# Patient Record
Sex: Male | Born: 1955 | Race: Black or African American | Hispanic: No | Marital: Married | Smoking: Former smoker
Health system: Southern US, Community
[De-identification: ages and names within clinical notes are randomized; demographics above are authoritative.]

## PROBLEM LIST (undated history)

## (undated) DIAGNOSIS — I10 Essential (primary) hypertension: Secondary | ICD-10-CM

## (undated) DIAGNOSIS — Z5189 Encounter for other specified aftercare: Secondary | ICD-10-CM

## (undated) HISTORY — PX: OTHER SURGICAL HISTORY: SHX169

## (undated) HISTORY — DX: Encounter for other specified aftercare: Z51.89

---

## 1998-01-28 ENCOUNTER — Ambulatory Visit (HOSPITAL_COMMUNITY): Admission: RE | Admit: 1998-01-28 | Discharge: 1998-01-28 | Payer: Self-pay | Admitting: Internal Medicine

## 1998-02-12 ENCOUNTER — Ambulatory Visit (HOSPITAL_COMMUNITY): Admission: RE | Admit: 1998-02-12 | Discharge: 1998-02-12 | Payer: Self-pay | Admitting: Gastroenterology

## 1998-04-08 ENCOUNTER — Ambulatory Visit (HOSPITAL_COMMUNITY): Admission: RE | Admit: 1998-04-08 | Discharge: 1998-04-08 | Payer: Self-pay | Admitting: Internal Medicine

## 1998-12-08 ENCOUNTER — Ambulatory Visit (HOSPITAL_COMMUNITY): Admission: RE | Admit: 1998-12-08 | Discharge: 1998-12-08 | Payer: Self-pay | Admitting: Internal Medicine

## 1998-12-08 ENCOUNTER — Encounter: Payer: Self-pay | Admitting: Internal Medicine

## 1999-03-12 ENCOUNTER — Ambulatory Visit (HOSPITAL_COMMUNITY): Admission: RE | Admit: 1999-03-12 | Discharge: 1999-03-12 | Payer: Self-pay | Admitting: Internal Medicine

## 1999-03-12 ENCOUNTER — Encounter: Payer: Self-pay | Admitting: Internal Medicine

## 1999-10-22 ENCOUNTER — Encounter: Payer: Self-pay | Admitting: Internal Medicine

## 1999-10-22 ENCOUNTER — Ambulatory Visit (HOSPITAL_COMMUNITY): Admission: RE | Admit: 1999-10-22 | Discharge: 1999-10-22 | Payer: Self-pay | Admitting: Internal Medicine

## 2000-03-03 ENCOUNTER — Ambulatory Visit (HOSPITAL_BASED_OUTPATIENT_CLINIC_OR_DEPARTMENT_OTHER): Admission: RE | Admit: 2000-03-03 | Discharge: 2000-03-03 | Payer: Self-pay | Admitting: Internal Medicine

## 2000-05-05 ENCOUNTER — Inpatient Hospital Stay (HOSPITAL_COMMUNITY): Admission: AD | Admit: 2000-05-05 | Discharge: 2000-05-09 | Payer: Self-pay | Admitting: Internal Medicine

## 2000-05-07 ENCOUNTER — Encounter: Payer: Self-pay | Admitting: Gastroenterology

## 2000-07-06 ENCOUNTER — Encounter (HOSPITAL_BASED_OUTPATIENT_CLINIC_OR_DEPARTMENT_OTHER): Payer: Self-pay | Admitting: General Surgery

## 2000-07-08 ENCOUNTER — Ambulatory Visit (HOSPITAL_COMMUNITY): Admission: RE | Admit: 2000-07-08 | Discharge: 2000-07-08 | Payer: Self-pay | Admitting: General Surgery

## 2000-07-13 ENCOUNTER — Encounter: Admission: RE | Admit: 2000-07-13 | Discharge: 2000-07-13 | Payer: Self-pay | Admitting: Internal Medicine

## 2000-07-13 ENCOUNTER — Encounter: Payer: Self-pay | Admitting: Internal Medicine

## 2002-10-03 ENCOUNTER — Encounter: Admission: RE | Admit: 2002-10-03 | Discharge: 2002-10-03 | Payer: Self-pay | Admitting: Internal Medicine

## 2002-10-03 ENCOUNTER — Encounter: Payer: Self-pay | Admitting: Internal Medicine

## 2003-07-10 ENCOUNTER — Encounter: Admission: RE | Admit: 2003-07-10 | Discharge: 2003-07-10 | Payer: Self-pay | Admitting: Internal Medicine

## 2004-10-05 ENCOUNTER — Ambulatory Visit (HOSPITAL_COMMUNITY): Admission: RE | Admit: 2004-10-05 | Discharge: 2004-10-05 | Payer: Self-pay | Admitting: Internal Medicine

## 2006-03-31 ENCOUNTER — Encounter: Admission: RE | Admit: 2006-03-31 | Discharge: 2006-03-31 | Payer: Self-pay | Admitting: Internal Medicine

## 2007-11-14 ENCOUNTER — Emergency Department (HOSPITAL_COMMUNITY): Admission: EM | Admit: 2007-11-14 | Discharge: 2007-11-14 | Payer: Self-pay | Admitting: Emergency Medicine

## 2009-12-30 ENCOUNTER — Encounter: Admission: RE | Admit: 2009-12-30 | Discharge: 2009-12-30 | Payer: Self-pay | Admitting: Internal Medicine

## 2010-03-24 ENCOUNTER — Ambulatory Visit (HOSPITAL_COMMUNITY): Admission: RE | Admit: 2010-03-24 | Discharge: 2010-03-24 | Payer: Self-pay | Admitting: Gastroenterology

## 2010-11-07 NOTE — H&P (Signed)
[place]. [facility]  Patient:    Victor Simmons, Victor Simmons                          MRN: [id number] Adm. Date:  [id number] Attending:  Gwenyth Bender                         History and Physical  CHIEF COMPLAINT:  Progressive weakness, orthostasis and low blood counts.  HISTORY OF PRESENT ILLNESS:  This is the second [facility] admission for this 55 year old married black male who presented to the office complaining of progressive weakness of several months duration.  The patient notes that he has been having increasing fatigue of two months duration. However, over the past several weeks, he has had increasing problems with marked exertional dyspnea and fatigue with a minimal amount of activity.  This has been associated with light headed sensations when standing quickly.  He denies significant chest pain or shortness of breath.  at the time of presentation, he noted that he was not able to do much of his regular activity without becoming extremely short of breath.  He denies hematemesis or melena. His history was significant for him having had extensive bleeding from his hemorrhoids for approximately two months duration apparently by the patients history.  He states, however, that this stopped approximately one month ago and has not resumed since that time.  He notably had not come into the office for further evaluation of this problem.  The patient does not smoke or drink.  He had had a similar episode of anemia approximately one year ago.  Evaluation at that time showed a normal colonoscopy, though he did have internal hemorrhoids.  He has been evaluated in the past for his hemorrhoids, and these were not removed at that time, but treated symptomatically with ProctoFoam.  The patient noted that he had not been taking any iron or multivitamins.  He notably has been working consistently during this time.  His work occasionally does require strenuous activity.   He had been developing extensive fatigue as noted above.  PAST MEDICAL HISTORY: 1. Allergic rhinitis. 2. Hypertension. 3. Posterior history of pneumonia. 4. Distant history of gastric ulcer secondary to NSAIDs in 1990.  FAMILY HISTORY:  Positive for hypertension and coronary artery disease.  REVIEW OF SYSTEMS:  As noted above.  In March, he was also diagnosed as having mild sleep apnea.  He has been on protriptyline for this.  He is due for further evaluation with Dr. Maple Hudson within the next months time.  He has had mild insomnia.  ALLERGIES:  ASPIRIN, which causes SOME VOMITING.  CURRENT MEDICATIONS: 1. Norvasc 5 mg q.d. 2. Allegra 60 mg b.i.d. 3. Celebrex 200 mg q.d. 4. Protriptyline 10 mg q.h.s.  PHYSICAL EXAMINATION:  GENERAL:  Well-developed, weak appearing black male in no acute distress.  VITAL SIGNS:  Height 6 feet 1 inch, weight 184 pounds, blood pressure sitting 130/82; standing 120/84, no significant change in pulse rate, respiratory rate 20, temperature 98.1.  HEENT:  Normocephalic and atraumatic without bruits.  Extraocular movements intact.  Fundi grade 0.  There is no sinus tenderness.  He has mild turbinate edema bilaterally.  TMs with decreased light reflex without edematous changes.  NECK:  Supple. No enlarged thyroid.  No positive cervical nodes.  LUNGS:  Clear without wheezes or rales.  CARDIOVASCULAR:  Normal S1 and S2.  No S3 or S4.  ABDOMEN:  Bowel sounds are present.  No enlarged liver or spleen.  No masses.  RECTAL:  A 1+ prostate without nodules.  Internal hemorrhoid noted.  No cords.  Mild to moderate tenderness.  Stool notably heme negative.  MUSCULOSKELETAL:  Passive range of motion of all extremities.  No cyanosis or clubbing.  No crepitus of the knees.  Mild tenderness in the wrist region. Negative Tinels sign.  NEUROLOGIC:  Alert and oriented x 3.  Cranial nerves intact.  Cerebellar, sensory and motor function intact.  LABORATORY  DATA:  Complete studies pending.  Hemoglobin on November 13,2001 was 7.  On repeat as of today, hemoglobin was 6.4 with a normal white count. Serum ferritin level was within normal limits.  Other laboratory studies pending at this time.  IMPRESSION: 1. Symptomatic anemia.  Patient with a history suggestive of progressive    decrease in blood count to the point of becoming symptomatic.  Question of    chronic loss without replacement versus acute loss.  Most likely source    wound the the GI tract. 2. History of rectal bleeding secondary to internal hemorrhoids.  The patient    on further questioning describes a prolonged history of rectal bleeding due    to hemorrhoids.  He apparently has not reported these symptoms and/or    received any treatment for this. 3. Helicobacter pylori exposure.  Labs done as an outpatient demonstrates a    positive H. pylori antibody.  This will need to be treated pending findings    on further GI evaluation as well. 4. Hypertension. 5. Sleep apnea, presently pending further evaluation.  PLAN:  The patient is admitted for further evaluation.  Will guaiac all stools.  He has been typed and cross matched for three units of packed rbcs in view of his low hemoglobin and symptoms.  Will place him on Pepcid b.i.d. Have Dr. Loreta Ave see for further evaluation.  If the above workup is normal, will replace his iron stores and consider surgical reevaluation for recurrent bleeding hemorrhoids.  Further therapy thereafter. DD:  05/05/00 TD:  05/05/00 Job: [id number]

## 2010-11-07 NOTE — Procedures (Signed)
[place]. [facility]  Patient:    Victor Simmons, Victor Simmons                          MRN: [id number] Proc. Date: 05/06/00 Adm. Date:  [id number] Attending:  Gwenyth Bender CC:         Lind Guest. August Saucer, M.D.  Mardene Celeste Lurene Shadow, M.D.   Procedure Report  DATE OF BIRTH:  08/16/1955  PROCEDURE:  Flexible sigmoidoscopy.  ENDOSCOPIST:  Anselmo Rod, M.D.  INSTRUMENT USED:  Olympus video panendoscope.  INDICATIONS:  Severe anemia in a 55 year old African-American male with a history of large internal hemorrhoids, rule out distal colonic lesions versus hemorrhoids.  PREPROCEDURE PREPARATION:  Informed consent was procured from the patient. The patient was fasted for eight hours prior to the procedure and prepped with two Fleets enemas the morning of the procedure.  PREPROCEDURE PHYSICAL EXAMINATION:  The patient had stable vital signs.  Neck supple.  Chest clear to auscultation.  S1 and S2 regular.  Abdomen soft with normal active bowel sounds.  DESCRIPTION OF PROCEDURE:  The patient was placed in the left lateral decubitus position.  No additional sedation was used for the flexible sigmoidoscopy.  Once the patient was adequately positioned, the Olympus video panendoscope was advanced into the rectum to 40 cm without difficulty.  The was solid stool beyond this point and the scope could not be advanced.  There was a prominent large internal hemorrhoid that seemed to have bled in the recent past seen on retroflexion.  No other masses or polyps were seen.  IMPRESSION:  Prominent large internal hemorrhoids with evidence of fresh bleeding.  No other masses or polyps seen.  RECOMMENDATIONS: 1. A small bowel follow through will be done to complete the patients GI    evaluation and to make sure there are no small bowel lesions contributing    to his anemia. 2. Discontinue Celebrex for now. 3. Surgical evaluation by Luisa Hart L. Lurene Shadow, M.D. DD:  05/06/00 TD:  05/06/00 Job:  [id number]

## 2010-11-07 NOTE — Op Note (Signed)
[employer]. [facility]  Patient:    Victor Simmons, Victor Simmons                          MRN: [id number] Proc. Date: 07/08/00 Attending:  Sonda Primes                           Operative Report  PREOPERATIVE DIAGNOSIS:  Hemorrhoidal disease.  POSTOPERATIVE DIAGNOSIS:  Hemorrhoidal disease.  PROCEDURES:  Examination under anesthesia, proctosigmoidoscopy to 25 cm, and hemorrhoidectomy.  SURGEON:  Mardene Celeste. Lurene Shadow, M.D.  ASSISTANT:  Nurse.  ANESTHESIA:  General.  INDICATION:  Patient is a 55 year old man presenting with severe painful and intermittently bleeding hemorrhoidal disease with persistent prolapse.  He is brought to the operating room now for hemorrhoidectomy.  DESCRIPTION OF PROCEDURE:  Following the induction of anesthesia, the patient as positioned in the prone position, jackknife, and the proctosigmoidoscope was introduced through the anus and maneuvered up to 25 cm.  No other mucosal abnormalities were noted.  The scope was removed.  The perianal tissues proximally and anus were prepped and draped to be included in the sterile operative field.  I infiltrated the perianal tissues with 1% Xylocaine with epinephrine and inserted the operating anoscope.  The patient had essentially circumferential hemorrhoids but with the largest protrusions of hemorrhoids being in the 12 oclock, 9 oclock, and 6 oclock positions.  Each of these were then infiltrated with 1% Xylocaine and with traction suture placed just below the dentate line, an elliptical incision made through the mucosa out to the mucocutaneous junction.  The hemorrhoid dissected free from the underlying sphincter muscles and removed in its entirety.  The mucosa and mucocutaneous junction closed with a running suture of 2-0 chromic catgut.  Each of three large hemorrhoidal complexes were treated in this manner with dissection laterally into each of the hemorrhoidal plexuses to remove most of  the hemorrhoidal veins.  At the end of the dissection, all areas of dissection were checked for hemostasis and noted to be dry.  Sponge, instrument, and sharp counts were verified, and saline-soaked Gelfoam pads were placed over each incision and a sterile dressing applied.  The anesthetic reversed and the patient removed from the operating room to the recovery room in stable condition, having tolerated the procedure well. DD:  07/08/00 TD:  07/08/00 Job: [id number]

## 2010-11-07 NOTE — Discharge Summary (Signed)
[place]. [facility]  Patient:    Victor Simmons, Victor Simmons                          MRN: [id number] Adm. Date:  05/05/00 Disc. Date: 05/09/00 Attending:  Sonda Primes                           Discharge Summary  FINAL DIAGNOSES: 1. Internal hemorrhoids with complications, 455.2. 2. 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 3. Duodenitis without hemorrhage, 535.60. 4. Stomach ulcer, 531.90. 5. Hypertension, 401.9. 6. Allergic rhinitis, 477.9. 7. Sleep apnea, 780.57  OPERATIONS/PROCEDURES:  Flexible colonoscopy per Dr. Elsie Amis with small-bowel endoscopy.  HISTORY OF PRESENT ILLNESS:  This was the second [facility] admission for this 55 year old married black male who presented to the office complaining of progressive weakness of several months duration.  He notes that he had become increasingly fatigued over two months duration.  Over the past few weeks prior to admission he had had increasing problems with exertional dyspnea with fatigue to a minimal amount of activity.  There has been some associated lightheadedness as well.  The patient denied hematemesis or melena.  His history was significant for him having had extensive bleeding from hemorrhoids of approximately two months duration by the patients reports.  He had, however, not come in for further evaluation.  He states, however, that he has not had any further symptoms over one months duration. Upon presentation, the patient was noted to be significantly anemic and was admitted for further evaluation and therapy.  PAST MEDICAL HISTORY:  As per admission H&P.  PHYSICAL EXAMINATION:  As per admission H&P.  HOSPITAL COURSE:  The patient was admitted for further treatment of severe anemia.  As noted, his hemoglobin was 6.4 at the time of admission.  Notably, stools were negative for occult blood.  His blood work, however, showed evidence for microcytic anemia with iron deficiency  indices.  The patient was subsequently transfused three units of packed red blood cells as he was symptomatic at the time of presentation.  He was seen in consultation by Dr. Carolyne Littles as well.  Further evaluation was pursued.  He did undergo EGD, which showed evidence for mild duodenitis without active bleeding. Notably, after transfusion his hemoglobin did rise to 9.7.  Because of his previous problem with bleeding and no obvious other abnormalities were found, he was seen by Dr. Lurene Shadow as well.  It was felt that he may, indeed, require a hemorrhoidectomy should no other source be found.  Notably, it was recommended that a repeat colonoscopy be performed.  This did require the patient to be thoroughly cleansed.  A small-bowel follow-through was also done, which showed no significant abnormality.  He subsequently did undergo colonoscopy per Dr. Elsie Amis.  He was found to have prominent internal hemorrhoids.  There was one isolated diverticulum in the right colon.  The area, otherwise, looked unremarkable.  It was felt that the patient had significant symptomatic anemia from extensive bleeding from hemorrhoids earlier.  He was found to be stable at this point. It was advised that he avoid use of any NSAIDs at this time.  The patient made steady progress thereafter.  He was subsequently discharged home much improved.  Plans would be made for an elective hemorrhoidectomy.  He was also noted to have a positive H. pylori which would need to be treated empirically as  well.  DISCHARGE MEDICATIONS: 1. Allegra 60 mg b.i.d. 2. Nortriptyline 10 mg p.o. q.h.s. 3. His Norvasc will be held pending stabilization of his blood pressure. 4. He will avoid Celebrex at this time.  FOLLOW-UP:  He will return to the office in two weeks time.  Clearance for work status will be obtained at that time. DD:  06/30/00 TD:  06/30/00 Job: [id number]

## 2010-11-07 NOTE — Procedures (Signed)
[place]. [facility]  Patient:    WISE, FEES                          MRN: [id number] Proc. Date: 05/09/00 Adm. Date:  [id number] Attending:  Gwenyth Bender CC:         Lind Guest. August Saucer, M.D.  Mardene Celeste Lurene Shadow, M.D.   Procedure Report  DATE OF BIRTH:  Apr 07, 1956  PROCEDURE PERFORMED:  Colonoscopy.  ENDOSCOPIST:  Anselmo Rod, M.D.  INSTRUMENT USED:  Olympus video colonoscope.  INDICATIONS FOR PROCEDURE:  Rectal bleeding in a 55 year old black male, rule out colonic polyps, masses, hemorrhoids, etc.  The patient had prominent internal hemorrhoids on a recent flexible sigmoidoscopy, and has had severe anemia secondary to his rectal bleeding.  PREPROCEDURE PREPARATION:  Informed consent was procured from the patient. The patient was fasted for eight hours prior to the procedure, and prepped with a bottle of magnesium citrate and a gallon of NuLytely the night prior to the procedure.  PREPROCEDURE PHYSICAL:  VITAL SIGNS:  Stable.  NECK:  Supple.  CHEST:  Clear to auscultation, S1 and S2 regular.  ABDOMEN:  Soft with normal abdominal bowel sounds.  DESCRIPTION OF PROCEDURE:  The patient was placed in the left lateral decubitus position, and sedated with 100 mg of Demerol and 7.5 mg of Versed intravenously.  Once the patient was adequately sedated, maintained on low flow oxygen and continuous cardiac monitoring, the Olympus video colonoscope was advanced from the rectum to the cecum without difficulty.  The patient had an isolated diverticulum in the right colon just distal to the cecum.  No erosions, ulcerations, masses, or polyps were seen.  There were prominent internal hemorrhoids seen on retroflexion in the rectum.  The patient tolerated the procedure well without complication.  IMPRESSION: 1. Small isolated diverticulum, right colon distal to the cecum, prominent    internal hemorrhoid.  No masses or polyps seen. 2. Some residual stool  in the right colon.  RECOMMENDATIONS: 1. The patient has been advised to increase the fluid and fiber in his diet. 2. Further recommendations as per Dr. August Saucer and Dr. Lurene Shadow. DD:  05/09/00 TD:  05/09/00 Job: [id number]

## 2010-11-07 NOTE — Procedures (Signed)
[place]. [facility]  Patient:    Victor Simmons, Victor Simmons                          MRN: [id number] Proc. Date: 05/06/00 Adm. Date:  [id number] Attending:  Gwenyth Bender CC:         Lind Guest. August Saucer, M.D.  Mardene Celeste Lurene Shadow, M.D.   Procedure Report  DATE OF BIRTH:  03-07-1956.  PROCEDURE:  Esophagogastroduodenoscopy.  ENDOSCOPIST:  Anselmo Rod, M.D.  INSTRUMENT USED:  Olympus video panendoscope.  INDICATION FOR PROCEDURE:  Severe anemia with a previous history of ulcer disease in a 55 year old African-American male.  Rule out peptic ulcer disease, esophagitis, gastritis, gastropathy.  PREPROCEDURE PREPARATION:  Informed consent was procured from the patient. The patient was fasted for eight hours prior to the procedure and received three units of packed red blood cells prior to the procedure.  Hemoglobin on admission was 6.4 g/dl.  After three units, hemoglobin was 9.7 g/dl.  PREPROCEDURE PHYSICAL:  VITAL SIGNS:  The patient had stable vital signs.  NECK:  Supple.  CHEST:  Clear to auscultation.  S1, S2 regular.  ABDOMEN:  Soft with normal abdominal bowel sounds.  DESCRIPTION OF PROCEDURE:  The patient was placed in the left lateral decubitus position and sedated with 60 mg of Demerol and 6 mg of Versed intravenously.  After the patient was adequately sedate and maintained on low-flow oxygen and continuous cardiac monitoring, the Olympus video panendoscope was advanced through the mouthpiece, over the tongue, into the esophagus under direct vision.  The entire esophagus appeared normal without evidence of ring, stricture, masses, lesions, esophagitis, or Barretts mucosa.  The scope was then advanced to the stomach.  There were multiple superficial antral erosions with no evidence of a visible vessel.  There was moderate to severe duodenitis in the duodenal bulb.  Small bowel distal to the bulb to 60 cm appeared normal.  Biopsies were not done, as the  patient has recent H. pylori serology that was positive.  IMPRESSION: 1. Normal-appearing esophagus. 2. Multiple superficial antral ulcers.  No visible vessel seen. 3. Moderate to severe duodenitis in duodenal bulb. 4. Normal small bowel distal to the bulb.  RECOMMENDATIONS: 1. Change Pepcid to Protonix 40 mg one p.o. q.d. 2. Treat with antibiotics for Helicobacter pylori. 3. Proceed with flexible sigmoidoscopy at this time. DD:  05/06/00 TD:  05/06/00 Job: [id number]

## 2010-11-07 NOTE — Consult Note (Signed)
[place]. [facility]  Patient:    Victor Simmons, Victor Simmons                          MRN: [id number] Proc. Date: 05/05/00 Adm. Date:  [id number] Attending:  Gwenyth Bender CC:         Lind Guest. August Saucer, M.D.  Mardene Celeste Lurene Shadow, M.D.   Consultation Report  REASON FOR CONSULTATION: Severe anemia, with hemoglobin of 6.4 g/dl.  ASSESSMENT:  1. Severe microcytic anemia, with hemoglobin 6.4 g/dl and MCV 14.7; rule out     recurrent peptic ulcer disease versus hemorrhoidal bleeding.  2. History of alcohol and marijuana use, rule out alcoholic gastropathy.  3. History of hypertension, on Norvasc.  4. History of seasonal allergies.  5. History of left wrist pain, on Celebrex.  6. History of nonsteroidal induced ulcer with oversew done in 1990 after     massive gastrointestinal bleeding.  7. History of Pneumococcal pneumonia in 1996.  RECOMMENDATIONS:  1. Agree with packed red blood cell transfusion tonight.  2. Serial CBCs.  3. Esophagogastroduodenoscopy and flexible sigmoidoscopy in a.m.  4. Further recommendations will be made once the above mentioned tests have     been done.  5. Avoid using all nonsteroidals.  6. Discontinue Celebrex.  DISCUSSION: Victor Simmons is a 55 year old black male admitted to [facility] to the service of Dr. Willey Blade when he presented with a history of weakness, fatigue, and malaise for the last few days.  He was found to have severe anemia with a hemoglobin of 7 g/dl in the office. Work-up at the hospital revealed a hemoglobin of 6.4 g/dl.  The patient denies any nausea, vomiting, fever, chills, or rigors.  He has a fairly good appetite without diarrhea or constipation.  There is no history of change in bowel habits.  He gives a history of hemorrhoid bleeding for three months which ceased about two months ago.  The patient says he has had no problem with rectal bleeding since then.  The patient, however, seems to downplay  his symptoms.  There are no genitourinary or cardiorespiratory problems.  He denies history of headache or syncope.  There are no ENT or ocular complaints. There is no history of abnormal weight loss or weight gain.  No musculoskeletal, vascular, or allergic problems.  He denies any bright red blood per rectum or melena in the last six to eight weeks.  ALLERGIES: No known drug allergies.  CURRENT MEDICATIONS:  1. Norvasc.  2. Claritin.  3. Celebrex.  SOCIAL HISTORY: He is married.  He is a Naval architect by profession.  He has three children.  He drinks about three to four alcoholic beverages per day and at times slightly more depending on the company he keeps.  He also smokes marijuana for recreational purposes.  He denies any use of cigarettes.  FAMILY HISTORY: There is no family history of colon or prostate cancer.  The patient denies any history of heart disease, diabetes, or hypertension.  His parents are healthy and alive.  REVIEW OF SYSTEMS: Severe weakness, malaise, and lethargy.  No history of GI bleeding in the form of bright red blood per rectum or melena in the last two months.  There was a history of hemorrhoidal bleeding for three months which stopped two months ago.  PHYSICAL EXAMINATION:  GENERAL: This patient is a slender black male, lying comfortably in bed and in no acute distress.  VITAL SIGNS: Temperature 98.4 degrees, blood pressure 120/80, pulse 88 per minute, respiratory rate 20 per minute.  HEENT: PERRL.  NECK: Supple.  No JVD, thyromegaly, or lymphadenopathy.  CHEST: Clear to auscultation.  HEART: S1 and S2, regular; no murmurs, rubs, or gallops.  ABDOMEN: Soft, with well-healed surgical scar noted in the midline and one transversely across the umbilicus.  Nontender, nondistended, with normal abdominal bowel sounds.  No hepatosplenomegaly.  No masses palpable.  RECTAL: Almost no stool noted in the rectal vault.  No masses palpable.  LABORATORY  DATA: WBC 5.8, hemoglobin 6.4 g/dl, MCV 16.1; platelets 096,045; reticulocyte count 1.3.  PT 13.1, PTT 27.  C-reactive protein less than 0.4. LFTs are pending.  The above recommendations have been made and further recommendations will be made when the procedures have been done. DD:  05/06/00 TD:  05/07/00 Job: [id number]

## 2011-01-27 ENCOUNTER — Ambulatory Visit
Admission: RE | Admit: 2011-01-27 | Discharge: 2011-01-27 | Disposition: A | Discharge status: Home / Self Care | Payer: Commercial Managed Care - PPO | Source: Ambulatory Visit | Attending: Internal Medicine | Admitting: Internal Medicine

## 2011-01-27 ENCOUNTER — Other Ambulatory Visit: Payer: Self-pay | Admitting: Internal Medicine

## 2011-01-27 DIAGNOSIS — J40 Bronchitis, not specified as acute or chronic: Secondary | ICD-10-CM

## 2011-05-20 ENCOUNTER — Encounter: Payer: Self-pay | Admitting: Emergency Medicine

## 2011-05-20 ENCOUNTER — Emergency Department (HOSPITAL_COMMUNITY): Payer: 59

## 2011-05-20 ENCOUNTER — Inpatient Hospital Stay (HOSPITAL_COMMUNITY)
Admission: EM | Admit: 2011-05-20 | Discharge: 2011-05-22 | DRG: 195 | Disposition: A | Discharge status: Home / Self Care | Payer: 59 | Attending: Family Medicine | Admitting: Family Medicine

## 2011-05-20 DIAGNOSIS — E876 Hypokalemia: Secondary | ICD-10-CM | POA: Diagnosis present

## 2011-05-20 DIAGNOSIS — F17200 Nicotine dependence, unspecified, uncomplicated: Secondary | ICD-10-CM | POA: Diagnosis present

## 2011-05-20 DIAGNOSIS — I10 Essential (primary) hypertension: Secondary | ICD-10-CM | POA: Diagnosis present

## 2011-05-20 DIAGNOSIS — J189 Pneumonia, unspecified organism: Principal | ICD-10-CM | POA: Diagnosis present

## 2011-05-20 DIAGNOSIS — E860 Dehydration: Secondary | ICD-10-CM | POA: Diagnosis present

## 2011-05-20 DIAGNOSIS — E871 Hypo-osmolality and hyponatremia: Secondary | ICD-10-CM

## 2011-05-20 HISTORY — DX: Essential (primary) hypertension: I10

## 2011-05-20 LAB — BASIC METABOLIC PANEL
CO2: 27 mEq/L (ref 19–32)
Calcium: 9.5 mg/dL (ref 8.4–10.5)
Creatinine, Ser: 1.38 mg/dL — ABNORMAL HIGH (ref 0.50–1.35)
GFR calc non Af Amer: 56 mL/min — ABNORMAL LOW (ref >90)
Sodium: 124 mEq/L — ABNORMAL LOW (ref 135–145)

## 2011-05-20 LAB — LACTIC ACID, PLASMA: Lactic Acid, Venous: 1.5 mmol/L (ref 0.5–2.2)

## 2011-05-20 MED ORDER — DEXTROSE 5 % IV SOLN
1.0000 g | Freq: Once | INTRAVENOUS | Status: AC
  Administered 2011-05-20: 1 g via INTRAVENOUS
  Filled 2011-05-20: qty 10

## 2011-05-20 MED ORDER — DEXTROSE 5 % IV SOLN
500.0000 mg | Freq: Once | INTRAVENOUS | Status: AC
  Administered 2011-05-21: 500 mg via INTRAVENOUS
  Filled 2011-05-20 (×2): qty 500

## 2011-05-20 NOTE — ED Notes (Signed)
Pt states he has been having fevers since yesterday.  Has been vomiting for 2 days.  [facility]. WBC 20.2. Neg for flu.

## 2011-05-21 ENCOUNTER — Encounter (HOSPITAL_COMMUNITY): Payer: Self-pay

## 2011-05-21 DIAGNOSIS — I10 Essential (primary) hypertension: Secondary | ICD-10-CM | POA: Diagnosis not present

## 2011-05-21 DIAGNOSIS — J189 Pneumonia, unspecified organism: Secondary | ICD-10-CM | POA: Diagnosis present

## 2011-05-21 DIAGNOSIS — E871 Hypo-osmolality and hyponatremia: Secondary | ICD-10-CM

## 2011-05-21 LAB — URINALYSIS, ROUTINE W REFLEX MICROSCOPIC
Glucose, UA: NEGATIVE mg/dL
Nitrite: NEGATIVE
Urobilinogen, UA: 0.2 mg/dL (ref 0.0–1.0)

## 2011-05-21 LAB — URINE MICROSCOPIC-ADD ON

## 2011-05-21 LAB — BASIC METABOLIC PANEL
Calcium: 8.9 mg/dL (ref 8.4–10.5)
GFR calc non Af Amer: >90 mL/min (ref >90)
Sodium: 128 mEq/L — ABNORMAL LOW (ref 135–145)

## 2011-05-21 LAB — CBC
MCH: 31.4 pg (ref 26.0–34.0)
MCHC: 33.7 g/dL (ref 30.0–36.0)
Platelets: 204 10*3/uL (ref 150–400)
RBC: 3.03 MIL/uL — ABNORMAL LOW (ref 4.22–5.81)

## 2011-05-21 LAB — CREATININE, SERUM: Creatinine, Ser: 0.88 mg/dL (ref 0.50–1.35)

## 2011-05-21 MED ORDER — METOPROLOL SUCCINATE ER 25 MG PO TB24
25.0000 mg | ORAL_TABLET | Freq: Every day | ORAL | Status: DC
  Administered 2011-05-21 – 2011-05-22 (×2): 25 mg via ORAL
  Filled 2011-05-21 (×2): qty 1

## 2011-05-21 MED ORDER — ACETAMINOPHEN 500 MG PO TABS: 1000.0000 mg | ORAL_TABLET | Freq: Four times a day (QID) | ORAL | Status: DC | PRN

## 2011-05-21 MED ORDER — SODIUM CHLORIDE 0.9 % IV SOLN
INTRAVENOUS | Status: DC
  Administered 2011-05-21: 18:00:00 via INTRAVENOUS

## 2011-05-21 MED ORDER — ZOLPIDEM TARTRATE 5 MG PO TABS
5.0000 mg | ORAL_TABLET | Freq: Every evening | ORAL | Status: DC | PRN
  Administered 2011-05-21: 5 mg via ORAL
  Filled 2011-05-21: qty 1

## 2011-05-21 MED ORDER — ONDANSETRON HCL 4 MG PO TABS: 4.0000 mg | ORAL_TABLET | Freq: Four times a day (QID) | ORAL | Status: DC | PRN

## 2011-05-21 MED ORDER — ZOLPIDEM TARTRATE 10 MG PO TABS: 10.0000 mg | ORAL_TABLET | Freq: Every evening | ORAL | Status: DC | PRN

## 2011-05-21 MED ORDER — POTASSIUM CHLORIDE CRYS ER 20 MEQ PO TBCR
40.0000 meq | EXTENDED_RELEASE_TABLET | Freq: Every day | ORAL | Status: DC
  Administered 2011-05-21 – 2011-05-22 (×2): 40 meq via ORAL
  Filled 2011-05-21 (×2): qty 2

## 2011-05-21 MED ORDER — AZITHROMYCIN 500 MG PO TABS
500.0000 mg | ORAL_TABLET | Freq: Every day | ORAL | Status: DC
  Administered 2011-05-21 – 2011-05-22 (×2): 500 mg via ORAL
  Filled 2011-05-21 (×2): qty 1

## 2011-05-21 MED ORDER — DILTIAZEM HCL ER COATED BEADS 360 MG PO CP24
360.0000 mg | ORAL_CAPSULE | Freq: Every day | ORAL | Status: DC
  Administered 2011-05-21 – 2011-05-22 (×2): 360 mg via ORAL
  Filled 2011-05-21 (×2): qty 1

## 2011-05-21 MED ORDER — ZOLPIDEM TARTRATE 5 MG PO TABS
5.0000 mg | ORAL_TABLET | Freq: Every day | ORAL | Status: DC
  Administered 2011-05-21: 5 mg via ORAL
  Filled 2011-05-21: qty 1

## 2011-05-21 MED ORDER — ONDANSETRON HCL 4 MG/2ML IJ SOLN: 4.0000 mg | Freq: Four times a day (QID) | INTRAMUSCULAR | Status: DC | PRN

## 2011-05-21 MED ORDER — DOXAZOSIN MESYLATE 4 MG PO TABS
4.0000 mg | ORAL_TABLET | Freq: Every day | ORAL | Status: DC
  Administered 2011-05-21: 4 mg via ORAL
  Filled 2011-05-21 (×2): qty 1

## 2011-05-21 MED ORDER — ENOXAPARIN SODIUM 40 MG/0.4ML ~~LOC~~ SOLN
40.0000 mg | SUBCUTANEOUS | Status: DC
  Administered 2011-05-21 – 2011-05-22 (×2): 40 mg via SUBCUTANEOUS
  Filled 2011-05-21 (×2): qty 0.4

## 2011-05-21 NOTE — ED Provider Notes (Signed)
History     CSN: [id number] Arrival date & time: 05/20/2011  7:40 PM   First MD Initiated Contact with Patient 05/20/11 2212      Chief Complaint  Patient presents with  . Nausea  . Dizziness    (Consider location/radiation/quality/duration/timing/severity/associated sxs/prior treatment) Patient is a 55 y.o. male presenting with vomiting. The history is provided by the patient.  Emesis  The current episode started more than 1 week ago. The problem occurs 2 to 4 times per day. The problem has been gradually worsening. The maximum temperature recorded prior to his arrival was 102 to 102.9 F. Associated symptoms include cough and a fever. Pertinent negatives include no abdominal pain, no chills and no diarrhea. Associated symptoms comments: He was seen prior to arrival at Urgent Care and was told he had pneumonia and needed to come here for more extensive evaluation..    Past Medical History  Diagnosis Date  . Hypertension     History reviewed. No pertinent past surgical history.  History reviewed. No pertinent family history.  History  Substance Use Topics  . Smoking status: Current Everyday Smoker -- 0.5 packs/day  . Smokeless tobacco: Not on file  . Alcohol Use: Yes      Review of Systems  Constitutional: Positive for fever. Negative for chills.  HENT: Negative.   Eyes: Negative.   Respiratory: Positive for cough.   Cardiovascular: Negative.   Gastrointestinal: Positive for nausea, vomiting and abdominal distention. Negative for abdominal pain and diarrhea.  Musculoskeletal: Negative.   Skin: Negative.   Neurological: Positive for light-headedness.    Allergies  Review of patient's allergies indicates no known allergies.  Home Medications   Current Outpatient Rx  Name Route Sig Dispense Refill  . ACETAMINOPHEN 500 MG PO TABS Oral Take 1,000 mg by mouth every 6 (six) hours as needed. Fever/pain     . DILTIAZEM HCL COATED BEADS 360 MG PO CP24 Oral Take 360 mg  by mouth daily.      Marland Kitchen DOXAZOSIN MESYLATE 8 MG PO TABS Oral Take 4 mg by mouth at bedtime.      . IBUPROFEN 200 MG PO TABS Oral Take 200 mg by mouth every 6 (six) hours as needed. Pain     . METOPROLOL SUCCINATE 25 MG PO TB24 Oral Take 25 mg by mouth daily.      . TRIAMTERENE-HCTZ 37.5-25 MG PO TABS Oral Take 1 tablet by mouth daily.        BP 143/79  Pulse 70  Temp(Src) 98.3 F (36.8 C) (Oral)  Resp 16  SpO2 99%  Physical Exam  Constitutional: He appears well-developed and well-nourished.  HENT:  Head: Normocephalic.  Mouth/Throat: Mucous membranes are dry.  Neck: Normal range of motion. Neck supple.  Cardiovascular: Normal rate and regular rhythm.   Pulmonary/Chest: Effort normal and breath sounds normal. He exhibits no tenderness.  Abdominal: Soft. Bowel sounds are normal. There is no tenderness. There is no rebound and no guarding.  Musculoskeletal: Normal range of motion.  Neurological: He is alert. No cranial nerve deficit.  Skin: Skin is warm and dry. No rash noted.  Psychiatric: He has a normal mood and affect.    ED Course  Procedures (including critical care time)  Labs Reviewed  BASIC METABOLIC PANEL - Abnormal; Notable for the following:    Sodium 124 (*)    Potassium 3.1 (*)    Chloride 88 (*)    Creatinine, Ser 1.38 (*)    GFR calc non Af  Amer 56 (*)    GFR calc Af Amer 65 (*)    All other components within normal limits  URINALYSIS, ROUTINE W REFLEX MICROSCOPIC - Abnormal; Notable for the following:    APPearance CLOUDY (*)    Hgb urine dipstick SMALL (*)    Leukocytes, UA SMALL (*)    All other components within normal limits  LACTIC ACID, PLASMA  URINE MICROSCOPIC-ADD ON  CULTURE, BLOOD (ROUTINE X 2)  CULTURE, BLOOD (ROUTINE X 2)   Dg Chest 2 View  05/20/2011  *RADIOLOGY REPORT*  Clinical Data: Cough and fever.  CHEST - 2 VIEW  Comparison: 01/27/2011  Findings: Stable evidence of COPD.  Superimposed subtle patchy density in the right lower lung  may represent developing acute pneumonia.  There is no evidence of edema or pleural fluid.  Heart size is normal.  IMPRESSION: COPD with potential right lower lung pneumonia.  Original Report Authenticated By: Reola Calkins, M.D.     No diagnosis found.    MDM  The patient arrives with CBC results obtained from Urgent Care showing a WBC of 20,000. No repeat CBC was performed.  Discussed with Dr. Conley Rolls for admission.         Rodena Medin, PA 05/21/11 [phone]

## 2011-05-21 NOTE — Progress Notes (Signed)
I agree with HPI/GPe and A/P per Dr. Demetrius Charity. LE   States feels much better "I want to go home".  NO SOB/CP/Fever/Dysuria Urine per patient was "clean catch" Willing to trial smoking cessation, poor sleep past 3 days, wishes sleep aid  BP 135/74  Pulse 50  Temp(Src) 99.1 F (37.3 C) (Oral)  Resp 16  Ht 6\' 1"  (1.854 m)  Wt 63.504 kg (140 lb)  BMI 18.47 kg/m2  SpO2 100% General appearance: alert, cooperative, appears older than stated age and good build, poor nutrition Throat: lips, mucosa, and tongue normal; teeth and gums normal Lungs: clear to auscultation bilaterally and normal percussion bilaterally Chest wall: no tenderness Heart: regular rate and rhythm, S1, S2 normal, no murmur, click, rub or gallop Abdomen: soft, non-tender; bowel sounds normal; no masses,  no organomegaly Pulses: 2+ and symmetric Skin: Skin color, texture, turgor normal. No rashes or lesions Neurologic: Grossly normal   Patient Active Hospital Problem List: PNA (pneumonia) (05/21/2011)   Assessment: Received 1 dose Rocephin and one dose Azithromycin-Continue Oral Azithro-could possibly wean to Azuithro 500 for 5 complete days  Hyponatremia (05/21/2011)   Assessment: Rpt Bmet this pm, and in am-Would D/c all diuretics and review as oupt   HTN (hypertension) (05/21/2011)   Assessment: Well controlled.  Wants normal salt diet   SMoker-Tob counselling

## 2011-05-21 NOTE — ED Provider Notes (Signed)
Medical screening examination/treatment/procedure(s) were performed by non-physician practitioner and as supervising physician I was immediately available for consultation/collaboration.  Cyndra Numbers, MD 05/21/11 [phone]

## 2011-05-21 NOTE — ED Notes (Signed)
Bathing Supplies Were Given 

## 2011-05-21 NOTE — H&P (Signed)
PCP:   No primary provider on file.   Chief Complaint: Cough, malaise, nausea and vomiting.  HPI: Victor Simmons is an 55 y.o. male with history of hypertension on several antihypertensive medications including diuretics, presents to [facility] long emergency room recommended from the provider at an urgent care.  He been having nausea and vomiting. On close questioning, he also had fever, chills, and a productive cough.  Evaluation in the emergency room significant for a chest x-ray which shows COPD along with possible right lower lobe infiltrate. He also has pyuria and his urinalysis. The has hyponatremia with serum sodium of 124.  Hospitalist was asked to admit him for pneumonia and hyponatremia.  Rewiew of Systems:  The patient denies anorexia, weight loss,, vision loss, decreased hearing, hoarseness, chest pain, syncope, dyspnea on exertion, peripheral edema, balance deficits, hemoptysis, abdominal pain, melena, hematochezia, severe indigestion/heartburn, hematuria, incontinence, genital sores, muscle weakness, suspicious skin lesions, transient blindness, difficulty walking, depression, unusual weight change, abnormal bleeding, enlarged lymph nodes, angioedema, and breast masses.    Past Medical History  Diagnosis Date  . Hypertension     History reviewed. No pertinent past surgical history.  Medications:  HOME MEDS: Prior to Admission medications   Medication Sig Start Date End Date Taking? Authorizing Provider  acetaminophen (TYLENOL) 500 MG tablet Take 1,000 mg by mouth every 6 (six) hours as needed. Fever/pain    Yes Historical Provider, MD  diltiazem (CARDIZEM CD) 360 MG 24 hr capsule Take 360 mg by mouth daily.     Yes Historical Provider, MD  doxazosin (CARDURA) 8 MG tablet Take 4 mg by mouth at bedtime.     Yes Historical Provider, MD  ibuprofen (ADVIL,MOTRIN) 200 MG tablet Take 200 mg by mouth every 6 (six) hours as needed. Pain    Yes Historical Provider, MD  metoprolol succinate  (TOPROL-XL) 25 MG 24 hr tablet Take 25 mg by mouth daily.     Yes Historical Provider, MD  triamterene-hydrochlorothiazide (MAXZIDE-25) 37.5-25 MG per tablet Take 1 tablet by mouth daily.     Yes Historical Provider, MD     Allergies:  No Known Allergies  Social History:   reports that he has been smoking.  He does not have any smokeless tobacco history on file. He reports that he drinks alcohol. He reports that he uses illicit drugs (Marijuana).  Family History: History reviewed. No pertinent family history.   Physical Exam: Filed Vitals:   05/20/11 1955 05/21/11 0009  BP: 134/83 143/79  Pulse: 85 70  Temp: 98.3 F (36.8 C)   TempSrc: Oral   Resp: 20 16  SpO2: 100% 99%   Blood pressure 143/79, pulse 70, temperature 98.3 F (36.8 C), temperature source Oral, resp. rate 16, SpO2 99.00%.  GEN:  Pleasant  person lying in the stretcher in no acute distress; cooperative with exam PSYCH:  alert and oriented x4; does not appear anxious does not appear depressed; affect is normal HEENT: Mucous membranes pink and anicteric; PERRLA; EOM intact; no cervical lymphadenopathy nor thyromegaly or carotid bruit; no JVD; Breasts:: Not examined CHEST WALL: No tenderness CHEST: Normal respiration, he has scattered rhonchi but no wheezing. No crackles HEART: Regular rate and rhythm; no murmurs rubs or gallops BACK: No kyphosis or scoliosis; no CVA tenderness ABDOMEN: Obese, soft non-tender; no masses, no organomegaly, normal abdominal bowel sounds; no pannus; no intertriginous candida. Rectal Exam: Not done EXTREMITIES: No bone or joint deformity; age-appropriate arthropathy of the hands and knees; no edema; no ulcerations. Genitalia: not  examined PULSES: 2+ and symmetric SKIN: Normal hydration no rash or ulceration CNS: Cranial nerves 2-12 grossly intact no focal neurologic deficit   Labs & Imaging Results for orders placed during the hospital encounter of 05/20/11 (from the past 48  hour(s))  BASIC METABOLIC PANEL     Status: Abnormal   Collection Time   05/20/11 10:40 PM      Component Value Range Comment   Sodium 124 (*) 135 - 145 (mEq/L)    Potassium 3.1 (*) 3.5 - 5.1 (mEq/L)    Chloride 88 (*) 96 - 112 (mEq/L)    CO2 27  19 - 32 (mEq/L)    Glucose, Bld 96  70 - 99 (mg/dL)    BUN 18  6 - 23 (mg/dL)    Creatinine, Ser 1.61 (*) 0.50 - 1.35 (mg/dL)    Calcium 9.5  8.4 - 10.5 (mg/dL)    GFR calc non Af Amer 56 (*) >90 (mL/min)    GFR calc Af Amer 65 (*) >90 (mL/min)   LACTIC ACID, PLASMA     Status: Normal   Collection Time   05/20/11 10:40 PM      Component Value Range Comment   Lactic Acid, Venous 1.5  0.5 - 2.2 (mmol/L)   URINALYSIS, ROUTINE W REFLEX MICROSCOPIC     Status: Abnormal   Collection Time   05/20/11 11:46 PM      Component Value Range Comment   Color, Urine YELLOW  YELLOW     APPearance CLOUDY (*) CLEAR     Specific Gravity, Urine 1.010  1.005 - 1.030     pH 5.5  5.0 - 8.0     Glucose, UA NEGATIVE  NEGATIVE (mg/dL)    Hgb urine dipstick SMALL (*) NEGATIVE     Bilirubin Urine NEGATIVE  NEGATIVE     Ketones, ur NEGATIVE  NEGATIVE (mg/dL)    Protein, ur NEGATIVE  NEGATIVE (mg/dL)    Urobilinogen, UA 0.2  0.0 - 1.0 (mg/dL)    Nitrite NEGATIVE  NEGATIVE     Leukocytes, UA SMALL (*) NEGATIVE    URINE MICROSCOPIC-ADD ON     Status: Normal   Collection Time   05/20/11 11:46 PM      Component Value Range Comment   Squamous Epithelial / LPF RARE  RARE     WBC, UA 11-20  <3 (WBC/hpf)    RBC / HPF 0-2  <3 (RBC/hpf)    Dg Chest 2 View  05/20/2011  *RADIOLOGY REPORT*  Clinical Data: Cough and fever.  CHEST - 2 VIEW  Comparison: 01/27/2011  Findings: Stable evidence of COPD.  Superimposed subtle patchy density in the right lower lung may represent developing acute pneumonia.  There is no evidence of edema or pleural fluid.  Heart size is normal.  IMPRESSION: COPD with potential right lower lung pneumonia.  Original Report Authenticated By: Reola Calkins, M.D.      Assessment Present on Admission:  .PNA (pneumonia) Hyponatremia Volume depletion Hypokalemia Tobacco abuse  PLAN:  Will admit him for IV antibiotics. He was given Rocephin and Zithromax in the emergency room and I will continue these 2 antibiotics.  He is hypovolemic hyponatremic, likely from diuretics. Will give him intravenous normal saline and follow his electrolytes closely. He is otherwise stable and will be admitted to [facility] It should be noted that he has a white count of 20,000 from the urgent care clinic, and this will be repeated in the morning  Other plans  as per orders.    Trevian Hayashida 05/21/2011, 1:52 AM

## 2011-05-22 LAB — BASIC METABOLIC PANEL
BUN: 9 mg/dL (ref 6–23)
Calcium: 9.2 mg/dL (ref 8.4–10.5)
GFR calc non Af Amer: >90 mL/min (ref >90)
Glucose, Bld: 92 mg/dL (ref 70–99)
Sodium: 128 mEq/L — ABNORMAL LOW (ref 135–145)

## 2011-05-22 LAB — CBC
MCH: 31.1 pg (ref 26.0–34.0)
MCHC: 33.7 g/dL (ref 30.0–36.0)
Platelets: 207 10*3/uL (ref 150–400)
RDW: 13.3 % (ref 11.5–15.5)

## 2011-05-22 MED ORDER — AZITHROMYCIN 500 MG PO TABS: 500.0000 mg | ORAL_TABLET | Freq: Every day | ORAL | Status: AC

## 2011-05-22 MED ORDER — ZOLPIDEM TARTRATE 10 MG PO TABS: 10.0000 mg | ORAL_TABLET | Freq: Every evening | ORAL | Status: AC | PRN

## 2011-05-22 NOTE — Discharge Summary (Signed)
Physician Discharge Summary  Patient ID: Victor Simmons MRN: [id number] DOB/AGE: [birth year] 55 y.o.  Admit date: 05/20/2011 Discharge date: 05/22/2011  Admission Diagnoses: Community-Acquired Pna Discharge Diagnoses:  Principal Problem:  *PNA (pneumonia) Active Problems:  Hyponatremia  HTN (hypertension)   Discharged Condition: good  Hospital Course: Victor Simmons is an 55 y.o. male with history of hypertension on several antihypertensive medications including diuretics, presents to [facility] long emergency room recommended from the provider at an urgent care. He been having nausea and vomiting.  On close questioning, he also had fever, chills, and a productive cough. Evaluation in the emergency room significant for a chest x-ray which shows COPD along with possible right lower lobe infiltrate. He also has pyuria and his urinalysis. The has hyponatremia with serum sodium of 124. Hospitalist was asked to admit him for pneumonia and hyponatremia.  Patient Active Hospital Problem List: PNA (pneumonia) (05/21/2011) Assessment: Received 1 dose Rocephin and one dose Azithromycin Felt better on D#1 of therapy and wished to go home Advised to monitor x 24 hours, and as he had no subsequent fever, was d/c home to complete full course Azithromycin as an outpatient   Hyponatremia (05/21/2011) Assessment:  D/c'd all diuretics-was noted that he was on various blood pressure meds which have the potential for dyselectrolytemia-Stopped Triam-HCTZ--D/c Sodium increased to 128 from 124.  Will continue Cardura/Metoprolol and Diltiazem and review with PCP  HTN (hypertension) (05/21/2011) Assessment: Mod controlled. Wanted normal salt diet in hospital  Smoker-Tob counseling  Sleep disorder-States works 2 jobs, one of them starts at 03:00 am-counseled him he will need to either change jobs, or alter sleep cycle.  To review with PCP.  Given Ambien to help       Consults: none  Significant Diagnostic  Studies: radiology: CXR: infiltrates: lower lobe on the right  Treatments: IV hydration and antibiotics: azithromycin  Discharge Exam: Blood pressure 137/77, pulse 53, temperature 98.4 F (36.9 C), temperature source Oral, resp. rate 16, height 6\' 1"  (1.854 m), weight 58.695 kg (129 lb 6.4 oz), SpO2 100.00%. General appearance: alert, cooperative and appears older than stated age Eyes: conjunctivae/corneas clear. PERRL, EOM's intact. Fundi benign. Throat: lips, mucosa, and tongue normal; teeth and gums normal Back: symmetric, no curvature. ROM normal. No CVA tenderness. Resp: clear to auscultation bilaterally and normal percussion bilaterally Cardio: regular rate and rhythm, S1, S2 normal, no murmur, click, rub or gallop Extremities: extremities normal, atraumatic, no cyanosis or edema Pulses: 2+ and symmetric Skin: Skin color, texture, turgor normal. No rashes or lesions  Disposition: Home  Discharge Orders    Future Orders Please Complete By Expires   Diet - low sodium heart healthy      Diet - low sodium heart healthy      Increase activity slowly      Discharge instructions      Comments:   Follow with Dr. Willey Blade   Call MD for:  temperature >100.4      Call MD for:  persistant nausea and vomiting      Call MD for:  severe uncontrolled pain      Call MD for:  difficulty breathing, headache or visual disturbances      Call MD for:  persistant dizziness or light-headedness      Increase activity slowly        Current Discharge Medication List    START taking these medications   Details  azithromycin (ZITHROMAX) 500 MG tablet Take 1 tablet (500 mg total) by mouth daily. Qty:  5 tablet, Refills: 0    zolpidem (AMBIEN) 10 MG tablet Take 1 tablet (10 mg total) by mouth at bedtime as needed for sleep (insomnia). Qty: 30 tablet, Refills: 0      CONTINUE these medications which have NOT CHANGED   Details  acetaminophen (TYLENOL) 500 MG tablet Take 1,000 mg by mouth every 6  (six) hours as needed. Fever/pain     diltiazem (CARDIZEM CD) 360 MG 24 hr capsule Take 360 mg by mouth daily.      doxazosin (CARDURA) 8 MG tablet Take 4 mg by mouth at bedtime.      metoprolol succinate (TOPROL-XL) 25 MG 24 hr tablet Take 25 mg by mouth daily.        STOP taking these medications     ibuprofen (ADVIL,MOTRIN) 200 MG tablet      triamterene-hydrochlorothiazide (MAXZIDE-25) 37.5-25 MG per tablet        Follow-up Information    Follow up with August Saucer, ERIC, MD. Make an appointment in 5 days.   Contact information:   [address], [address] [facility] [place] [address] [phone]          To DO :-Bmet in 5-6 days Rpt CXR   Signed: Pratt Bress,JAI 05/22/2011, 1:52 PM

## 2011-05-22 NOTE — Progress Notes (Signed)
Pt for d/c home today. IV d/c'd. Pt is ambulatory. No coughing noted. Encouraged to stop smoking. Denies any pain/discomfort. No changes in am assessments. Discharge instructions & Rx's given with verbalized understanding. 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 Pt will drive self home as insisted & wife is aware of it. Pt aware to set up an appointment with PCP.

## 2011-05-27 LAB — CULTURE, BLOOD (ROUTINE X 2)
Culture  Setup Time: 201211290410
Culture  Setup Time: 201211290410
Culture: NO GROWTH
Culture: NO GROWTH

## 2012-10-28 ENCOUNTER — Encounter: Payer: Self-pay | Admitting: Internal Medicine

## 2013-03-14 ENCOUNTER — Other Ambulatory Visit: Payer: Self-pay | Admitting: Internal Medicine

## 2013-03-14 DIAGNOSIS — R918 Other nonspecific abnormal finding of lung field: Secondary | ICD-10-CM

## 2013-03-20 ENCOUNTER — Other Ambulatory Visit: Payer: 59

## 2014-01-03 ENCOUNTER — Ambulatory Visit (INDEPENDENT_AMBULATORY_CARE_PROVIDER_SITE_OTHER): Payer: 59

## 2014-01-03 ENCOUNTER — Ambulatory Visit (INDEPENDENT_AMBULATORY_CARE_PROVIDER_SITE_OTHER): Payer: 59 | Admitting: Emergency Medicine

## 2014-01-03 VITALS — BP 138/84 | HR 51 | Temp 98.8°F | Resp 14 | Ht 71.0 in | Wt 130.0 lb

## 2014-01-03 DIAGNOSIS — S6991XA Unspecified injury of right wrist, hand and finger(s), initial encounter: Secondary | ICD-10-CM

## 2014-01-03 DIAGNOSIS — S6980XA Other specified injuries of unspecified wrist, hand and finger(s), initial encounter: Secondary | ICD-10-CM

## 2014-01-03 DIAGNOSIS — S6990XA Unspecified injury of unspecified wrist, hand and finger(s), initial encounter: Secondary | ICD-10-CM

## 2014-01-03 NOTE — Progress Notes (Signed)
[facility] [address], [place] [address] [phone]  Date:  01/03/2014   Name:  Victor Simmons   DOB:  01/02/1956   MRN:  [id number]  PCP:  No PCP Per Patient    Chief Complaint: Hand Injury   History of Present Illness:  Victor Simmons is a 58 y.o. very pleasant male patient who presents with the following:  Pinched his right thumb between a piece of machinery being moved and a steel door frame on Friday.  Has pain and increasing subungal hematoma.  Denies other complaint or health concern today.   Patient Active Problem List   Diagnosis Date Noted  . PNA (pneumonia) 05/21/2011  . Hyponatremia 05/21/2011  . HTN (hypertension) 05/21/2011    Past Medical History  Diagnosis Date  . Hypertension   . Blood transfusion without reported diagnosis     Past Surgical History  Procedure Laterality Date  . Ulcer surgery      History  Substance Use Topics  . Smoking status: Current Every Day Smoker -- 0.50 packs/day for 30 years    Types: Cigarettes  . Smokeless tobacco: Never Used  . Alcohol Use: 4.1 oz/week    6 Cans of beer, 1 Drinks containing 0.5 oz of alcohol per week    History reviewed. No pertinent family history.  Allergies  Allergen Reactions  . Aspirin     Medication list has been reviewed and updated.  Current Outpatient Prescriptions on File Prior to Visit  Medication Sig Dispense Refill  . acetaminophen (TYLENOL) 500 MG tablet Take 1,000 mg by mouth every 6 (six) hours as needed. Fever/pain       . diltiazem (CARDIZEM CD) 360 MG 24 hr capsule Take 360 mg by mouth daily.        Marland Kitchen. doxazosin (CARDURA) 8 MG tablet Take 4 mg by mouth at bedtime.        . metoprolol succinate (TOPROL-XL) 25 MG 24 hr tablet Take 25 mg by mouth daily.         No current facility-administered medications on file prior to visit.    Review of Systems:  As per HPI, otherwise negative.    Physical Examination: Filed Vitals:   01/03/14 1711  BP:  138/84  Pulse: 51  Temp: 98.8 F (37.1 C)  Resp: 14   Filed Vitals:   01/03/14 1711  Height: 5\' 11"  (1.803 m)  Weight: 130 lb (58.968 kg)   Body mass index is 18.14 kg/(m^2). Ideal Body Weight: Weight in (lb) to have BMI = 25: 178.9   GEN: WDWN, NAD, Non-toxic, Alert & Oriented x 3 HEENT: Atraumatic, Normocephalic.  Ears and Nose: No external deformity. EXTR: No clubbing/cyanosis/edema NEURO: Normal gait.  PSYCH: Normally interactive. Conversant. Not depressed or anxious appearing.  Calm demeanor.  RIGHT thumb  subungal hematoma   Assessment and Plan: Xray Evacuate subungal hematoma   Signed,  Phillips OdorJeffery Trygve Thal, MD   [facility] reading (PRIMARY) by  Dr. Dareen PianoAnderson. Negative thumb.

## 2014-03-27 ENCOUNTER — Ambulatory Visit (INDEPENDENT_AMBULATORY_CARE_PROVIDER_SITE_OTHER): Payer: Self-pay | Admitting: Family Medicine

## 2014-03-27 VITALS — BP 118/74 | HR 59 | Temp 97.6°F | Resp 16 | Ht 72.0 in | Wt 132.0 lb

## 2014-03-27 DIAGNOSIS — Z0289 Encounter for other administrative examinations: Secondary | ICD-10-CM

## 2014-03-27 DIAGNOSIS — Z008 Encounter for other general examination: Secondary | ICD-10-CM

## 2014-03-27 NOTE — Progress Notes (Signed)
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  Last DOT certification was many years ago. 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  Medical History: no  Any illness or injury in the last 5 years? no  Head/Brain Injuries, disorders or illnesses no  Seizures, epilepsy no  Eye disorders or impaired vision (except corrective lenses) no  Ear disorders, loss of hearing or balance no  Heart disease or heart attack; other cardiovascular condition no  Heart surgery (valve replacement/bypass, angioplasty, pacemaker) yes  High blood pressure no  Muscular disease no  Shortness of breath no  Lung disease, emphysema, asthma, chronic bronchitis no  Kidney disease, dialysis no  Liver disease no  Digestive problems no  Diabetes or elevated blood sugar no  Nervious or psychiatric disorders, e.g., severe depression no  Loss of, or altered consciousness no  Fainting, dizziness no  Sleep disorders, pauses in breathing while asleep, daytime sleepiness, loud snoring no  Stroke or paralysis no  Missing or impaired hand, arm, foot, leg, finger, toe no  Spinal injury or disease no  Chronic low back pain no  Regular, frequent alcohol use no  Narcotic or habit forming drug use Has a f/u sched w/ his PCP next mo.  He has lost 45 lbs involuntarily in the past 8 yrs but no recent weight loss.  Current Medications: Prior to Admission medications   Medication Sig Start Date End Date Taking? Authorizing Provider  acetaminophen (TYLENOL) 500 MG tablet Take 1,000 mg by mouth every 6 (six) hours as needed. Fever/pain    Yes Historical Provider, MD  diltiazem (CARDIZEM CD) 360 MG 24 hr capsule Take 360 mg by mouth daily.     Yes Historical Provider, MD  doxazosin (CARDURA) 8 MG tablet Take 4 mg by mouth at bedtime.     Yes Historical Provider, MD  metoprolol succinate (TOPROL-XL) 25 MG 24 hr tablet  Take 25 mg by mouth daily.     Yes Historical Provider, MD    Primary Care Provider:  Dr. 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 He reports taking medication for high blood pressure: Losartin and Cardia XT, as well as prostate pill for urine. He denied history of sleep apnea, heart attack, or stroke. He reports hospitalization for pneumonia 3 years ago from which he has fully recovered; he denies surgeries or injuries in the past 5 years. He states he smokes approximately 10 cigarettes a day. He states he has lost 40 pounds over the past 8 years. He states he last saw Dr. August Saucerean 6 months ago but has a visit scheduled in 1 month for a CPE.   Denies night sweats, appetite changes   TESTING:   Visual Acuity Screening   Right eye Left eye Both eyes  Without correction: 20/25-1 20/15-1 20/13-2  With correction:     Comments: Peripheral Vision: Right eye 85 degrees. Left eye 85 degrees. The patient can distinguish the colors red, amber and green.  Hearing Screening Comments: The patient was able to hear a forced whisper from 10 feet.  Monocular Vision: No.  Hearing Aid used for test: No. Hearing Aid required to to meet standard: No. Distance from individual at  which forced whispered voice can first be heard:   RIGHT ear 10 feet; LEFT ear 10 feet If audiometer used, record hearing loss in decibels:  RIGHT ear average na dB  LEFT ear average na dB  BP 118/74  Pulse 59  Temp(Src) 97.6 F (36.4 C) (Oral)  Resp 16  Ht 6' (1.829 m)  Wt 132 lb (59.875 kg)  BMI 17.90 kg/m2  SpO2 100% Pulse rate is regular  Urine Specimen: Specific Gravity 1.010, Protein neg, Blood neg, Sugar neg  Other Testing: none  PHYSICAL EXAMINATION:  1. No. General Appearance 2. No. Eyes   3. No. Ears     4.  No. Mouth and Throat    5. Yes. Heart     6. No. Lungs and Chest, not including breast examination  7. No. Abdomen and Viscera   8. No. Vascular System    9. No. Genitourinary System   10. No. Extremities-Limb impaired.  11. No. Spine, other musculoskeletal  12. No. Neurological     Comments: Heart exam benign except for 2/6 systolic murmur to left sternal border.  Certification Status: does not meet standards for 2 year certificate. Meets standards, but periodic monitoring required due to: HTN and tob use  Driver qualified only for: 1 year    Return to medical examiner's office for follow-up on 03/28/15  Wearing corrective lenses: no Wearing hearing aid: no   Certification expires 1 year

## 2015-03-19 ENCOUNTER — Ambulatory Visit: Payer: 59 | Admitting: Podiatry

## 2015-03-21 ENCOUNTER — Ambulatory Visit (INDEPENDENT_AMBULATORY_CARE_PROVIDER_SITE_OTHER): Payer: 59 | Admitting: Podiatry

## 2015-03-21 ENCOUNTER — Ambulatory Visit (INDEPENDENT_AMBULATORY_CARE_PROVIDER_SITE_OTHER): Payer: 59

## 2015-03-21 ENCOUNTER — Encounter: Payer: Self-pay | Admitting: Podiatry

## 2015-03-21 VITALS — BP 120/66 | HR 44 | Resp 16 | Ht 73.0 in | Wt 155.0 lb

## 2015-03-21 DIAGNOSIS — L84 Corns and callosities: Secondary | ICD-10-CM

## 2015-03-21 DIAGNOSIS — M79672 Pain in left foot: Secondary | ICD-10-CM | POA: Diagnosis not present

## 2015-03-21 DIAGNOSIS — M79671 Pain in right foot: Secondary | ICD-10-CM

## 2015-03-21 DIAGNOSIS — M2040 Other hammer toe(s) (acquired), unspecified foot: Secondary | ICD-10-CM | POA: Diagnosis not present

## 2015-03-21 NOTE — Progress Notes (Signed)
   Subjective:    Patient ID: Victor Simmons, male    DOB: 1955/12/05, 59 y.o.   MRN: [id number]  HPI Patient presents with bilateral callouses. Right foot-great toe-bottom of toe; lateral side; Left foot-ball of foot-medial & ball of foot.  Patient also presents with bilateral ingrown toenails, pinky toes - lateral side, x30 years.  Patient also presents with a wart on their Right foot-ball of foot-towards medial side.  Review of Systems  All other systems reviewed and are negative.      Objective:   Physical Exam        Assessment & Plan:

## 2015-03-21 NOTE — Progress Notes (Signed)
Subjective:     Patient ID: Victor Simmons, male   DOB: 1956-05-21, 59 y.o.   MRN: [id number]  HPI patient presents with painful severe corn formation fifth digit right over left and second toe right in the bottom of the right big toe. States they've been there for a long time he's tried to trim them tried other modalities and it's not working and he wants to know what can be done   Review of Systems  All other systems reviewed and are negative.      Objective:   Physical Exam  Constitutional: He is oriented to person, place, and time.  Cardiovascular: Intact distal pulses.   Musculoskeletal: Normal range of motion.  Neurological: He is oriented to person, place, and time.  Skin: Skin is warm.  Nursing note and vitals reviewed.  neurovascular status intact muscle strength adequate range of motion within normal limits with patient noted to have severe rotation fifth digit bilateral with distal lateral keratotic lesions that are very painful right over left foot and keratotic lesion between the big toe second toe right and on the bottom of the right big toe. He has a history of lesion formation and has family history of this which is contributory and was noted to have good digital perfusion and is well oriented 3     Assessment:     Severe digital rotation of the fifth digit bilateral causing distal lateral keratotic lesions along with compression between the big toe second toe right creating keratotic lesion formation    Plan:     H&P and condition reviewed with patient. I do think ultimately distal derotational arthroplasties with distal lateral exostectomy is will be necessary but at this point I have recommended deep debridement and padding and we will see how he progresses over the next 10 weeks. If he has this done and he will do best to have this done over the Christmas holiday and we will look at him beginning of December see the results and see how he responds. Debridement accomplished  today along with padding

## 2015-05-23 ENCOUNTER — Ambulatory Visit: Payer: 59 | Admitting: Podiatry

## 2015-07-12 MED FILL — DILTIAZEM 24HR ER 240 MG CA: 240 | 30 days supply | Qty: 30 | Fill #7

## 2015-08-13 MED FILL — DILTIAZEM 24HR ER 240 MG CA: 240 | 90 days supply | Qty: 90 | Fill #0

## 2015-08-22 DIAGNOSIS — R1013 Epigastric pain: Secondary | ICD-10-CM | POA: Diagnosis not present

## 2015-08-22 DIAGNOSIS — N400 Enlarged prostate without lower urinary tract symptoms: Secondary | ICD-10-CM | POA: Diagnosis not present

## 2015-08-22 DIAGNOSIS — I10 Essential (primary) hypertension: Secondary | ICD-10-CM | POA: Diagnosis not present

## 2015-08-22 DIAGNOSIS — R634 Abnormal weight loss: Secondary | ICD-10-CM | POA: Diagnosis not present

## 2015-08-27 DIAGNOSIS — R634 Abnormal weight loss: Secondary | ICD-10-CM | POA: Diagnosis not present

## 2015-09-16 MED FILL — LOSARTAN-HCTZ 100-12.5 MG T: 100-12.5 | 90 days supply | Qty: 90 | Fill #1

## 2015-10-08 MED FILL — TAMSULOSIN HCL 0.4 MG CAP: 0.4 | 90 days supply | Qty: 90 | Fill #1

## 2015-11-07 MED FILL — CARTIA XT 240 MG CAPSULE: 240 | 90 days supply | Qty: 90 | Fill #1

## 2015-12-05 DIAGNOSIS — I10 Essential (primary) hypertension: Secondary | ICD-10-CM | POA: Diagnosis not present

## 2015-12-05 DIAGNOSIS — F17201 Nicotine dependence, unspecified, in remission: Secondary | ICD-10-CM | POA: Diagnosis not present

## 2015-12-05 DIAGNOSIS — K4090 Unilateral inguinal hernia, without obstruction or gangrene, not specified as recurrent: Secondary | ICD-10-CM | POA: Diagnosis not present

## 2015-12-05 DIAGNOSIS — E876 Hypokalemia: Secondary | ICD-10-CM | POA: Diagnosis not present

## 2015-12-05 MED FILL — KLOR-CON M20 TABLET: 20 | 30 days supply | Qty: 60 | Fill #0

## 2015-12-06 DIAGNOSIS — R1031 Right lower quadrant pain: Secondary | ICD-10-CM | POA: Diagnosis not present

## 2015-12-25 MED FILL — LOSARTAN-HCTZ 100-12.5 MG T: 100-12.5 | 90 days supply | Qty: 90 | Fill #0

## 2016-01-03 MED FILL — KLOR-CON M20 TABLET: 20 | 30 days supply | Qty: 60 | Fill #1

## 2016-01-09 MED FILL — TAMSULOSIN HCL 0.4 MG CAP: 0.4 | 90 days supply | Qty: 90 | Fill #2

## 2016-01-21 ENCOUNTER — Ambulatory Visit (INDEPENDENT_AMBULATORY_CARE_PROVIDER_SITE_OTHER): Payer: 59 | Admitting: Physician Assistant

## 2016-01-21 VITALS — BP 120/82 | HR 64 | Temp 98.1°F | Resp 18 | Ht 73.0 in | Wt 144.0 lb

## 2016-01-21 DIAGNOSIS — M62838 Other muscle spasm: Secondary | ICD-10-CM

## 2016-01-21 DIAGNOSIS — R634 Abnormal weight loss: Secondary | ICD-10-CM

## 2016-01-21 DIAGNOSIS — R112 Nausea with vomiting, unspecified: Secondary | ICD-10-CM | POA: Diagnosis not present

## 2016-01-21 DIAGNOSIS — E639 Nutritional deficiency, unspecified: Secondary | ICD-10-CM

## 2016-01-21 LAB — POCT CBC
Granulocyte percent: 66.5 %G (ref 37–80)
HCT, POC: 39 % — AB (ref 43.5–53.7)
HEMOGLOBIN: 13.9 g/dL — AB (ref 14.1–18.1)
Lymph, poc: 0.7 (ref 0.6–3.4)
MCH: 31.3 pg — AB (ref 27–31.2)
MCHC: 35.7 g/dL — AB (ref 31.8–35.4)
MCV: 87.9 fL (ref 80–97)
MID (cbc): 0.3 (ref 0–0.9)
MPV: 6.3 fL (ref 0–99.8)
PLATELET COUNT, POC: 220 10*3/uL (ref 142–424)
POC Granulocyte: 2 (ref 2–6.9)
POC LYMPH PERCENT: 24.7 %L (ref 10–50)
POC MID %: 8.8 % (ref 0–12)
RBC: 4.43 M/uL — AB (ref 4.69–6.13)
RDW, POC: 13.5 %
WBC: 3 10*3/uL — AB (ref 4.6–10.2)

## 2016-01-21 LAB — GLUCOSE, POCT (MANUAL RESULT ENTRY): POC Glucose: 84 mg/dl (ref 70–99)

## 2016-01-21 LAB — POCT URINALYSIS DIP (MANUAL ENTRY)
BILIRUBIN UA: NEGATIVE
Glucose, UA: NEGATIVE
Ketones, POC UA: NEGATIVE
Leukocytes, UA: NEGATIVE
NITRITE UA: NEGATIVE
PH UA: 6.5
Protein Ur, POC: NEGATIVE
RBC UA: NEGATIVE
Spec Grav, UA: <=1.005
UROBILINOGEN UA: 0.2

## 2016-01-21 LAB — COMPLETE METABOLIC PANEL WITH GFR
ALT: 16 U/L (ref 9–46)
AST: 23 U/L (ref 10–35)
Albumin: 4.1 g/dL (ref 3.6–5.1)
Alkaline Phosphatase: 78 U/L (ref 40–115)
BUN: 10 mg/dL (ref 7–25)
CHLORIDE: 95 mmol/L — AB (ref 98–110)
CO2: 24 mmol/L (ref 20–31)
Calcium: 9.2 mg/dL (ref 8.6–10.3)
Creat: 0.93 mg/dL (ref 0.70–1.25)
GFR, Est African American: >89 mL/min (ref >=60)
GFR, Est Non African American: 89 mL/min (ref >=60)
GLUCOSE: 75 mg/dL (ref 65–99)
POTASSIUM: 4.6 mmol/L (ref 3.5–5.3)
SODIUM: 124 mmol/L — AB (ref 135–146)
Total Bilirubin: 0.9 mg/dL (ref 0.2–1.2)
Total Protein: 6.7 g/dL (ref 6.1–8.1)

## 2016-01-21 LAB — TSH: TSH: 0.72 m[IU]/L (ref 0.40–4.50)

## 2016-01-21 MED ORDER — METHOCARBAMOL 500 MG PO TABS
500.0000 mg | ORAL_TABLET | Freq: Two times a day (BID) | ORAL | 0 refills | Status: AC | PRN
Start: 2016-01-21 — End: 2016-01-31

## 2016-01-21 MED ORDER — ONDANSETRON 8 MG PO TBDP: 8.0000 mg | ORAL_TABLET | Freq: Three times a day (TID) | ORAL | 0 refills | Status: AC | PRN

## 2016-01-21 MED FILL — ONDANSETRON ODT 8 MG TABLET: 8 | 7 days supply | Qty: 20 | Fill #0

## 2016-01-21 MED FILL — METHOCARBAMOL 500 MG TABLET: 500 | 10 days supply | Qty: 20 | Fill #0

## 2016-01-21 MED FILL — MAGNESIUM OXIDE 400 MG TAB: 400 | 30 days supply | Qty: 60 | Fill #0

## 2016-01-21 NOTE — Patient Instructions (Addendum)
     IF you received an x-ray today, you will receive an invoice from [facility]. Please contact [facility] at [phone] with questions or concerns regarding your invoice.   IF you received labwork today, you will receive an invoice from [employer]. Please contact [facility] at [phone] with questions or concerns regarding your invoice.   Our billing staff will not be able to assist you with questions regarding bills from these companies.  You will be contacted with the lab results as soon as they are available. The fastest way to get your results is to activate your My Chart account. Instructions are located on the last page of this paperwork. If you have not heard from [place] regarding the results in 2 weeks, please contact this office.    Split the toprol in half at this time I would like you to take the methocarbamol as prescribed.  Please be mindful of sedation, and do not operate heavy machinery with that.  Increase your hydration with 4 regular sized bottles of water per day. Eat 3 square meals per day.  Please include meats, vegetables, and starch (potatoes).

## 2016-01-21 NOTE — Progress Notes (Signed)
Patient ID: Victor Simmons, male   DOB: 29-Apr-1956, 60 y.o.   MRN: [id number] [facility] [address] [place] [address] [phone]  Date:  01/21/2016   Name:  Victor Simmons   DOB:  1955/12/05   MRN:  [id number]  PCP:  Gwenyth Bender, MD   By signing my name below, I, Charline Bills, attest that this documentation has been prepared under the direction and in the presence of Trena Platt, PA-C Electronically Signed: Charline Bills, ED Scribe 01/21/2016 at 9:14 AM.  History of Present Illness:  Victor Simmons is a 60 y.o. male patient who presents to [facility] complaining of a feeling of dehydration over the past 6 days. Pt states that he drove 12 hours to [place] on a business trip 6 days ago and has experienced fatigue, diarrhea and cramps in his lower extremities since. Pt denies consuming any water on his drive to [place] but states that he typically has 3 glasses of water daily. He states that his diet consists of hamburgers, chicken and breakfast food while traveling. He also reports a 6 lb weight loss since his last visit; he reports a decreased appetite in the summer. Pt denies nausea, dizziness, lightheadedness, blood in stools, cramping in his upper extremities. He reports having 1-2 beers daily. No h/o DM or rhabdomyolysis. Pt is followed by Dr. August Saucer on [facility]; his next appointment is on 02/07/16.   Patient Active Problem List   Diagnosis Date Noted   PNA (pneumonia) 05/21/2011   Hyponatremia 05/21/2011   HTN (hypertension) 05/21/2011    Past Medical History:  Diagnosis Date   Blood transfusion without reported diagnosis    Hypertension     Past Surgical History:  Procedure Laterality Date   ulcer surgery      Social History  Substance Use Topics   Smoking status: Former Smoker    Packs/day: 0.50    Years: 30.00    Types: Cigarettes   Smokeless tobacco: Never Used   Alcohol use 4.1 oz/week    6 Cans of beer, 1 Standard drinks or  equivalent per week    No family history on file.  Allergies  Allergen Reactions   Aspirin Other (See Comments)    Does not dissolve in digestive tract   Medication list has been reviewed and updated.    Review of Systems  Constitutional: Positive for malaise/fatigue.  Gastrointestinal: Positive for diarrhea. Negative for blood in stool and nausea.  Neurological: Negative for dizziness.    Physical Examination: BP 120/82 (BP Location: Left Arm, Patient Position: Sitting, Cuff Size: Normal)    Pulse 64    Temp 98.1 F (36.7 C) (Oral)    Resp 18    Ht 6\' 1"  (1.854 m)    Wt 144 lb (65.3 kg)    SpO2 97%    BMI 19.00 kg/m  Ideal Body Weight: @FLOWAMB ([id number])@  Physical Exam  Constitutional: He is oriented to person, place, and time. He appears well-developed and well-nourished. No distress.  HENT:  Head: Normocephalic and atraumatic.  Eyes: Conjunctivae and EOM are normal. Pupils are equal, round, and reactive to light.  Cardiovascular: Normal rate, regular rhythm and normal heart sounds.  Exam reveals no gallop and no friction rub.   No murmur heard. Pulses:      Radial pulses are 2+ on the right side, and 2+ on the left side.       Dorsalis pedis pulses are 2+ on the right side, and 2+  on the left side.  Pulmonary/Chest: Effort normal and breath sounds normal. No respiratory distress. He has no decreased breath sounds. He has no wheezes. He has no rhonchi. He has no rales.  Neurological: He is alert and oriented to person, place, and time.  Skin: Skin is warm and dry. He is not diaphoretic.  Psychiatric: He has a normal mood and affect. His behavior is normal.   Results for orders placed or performed in visit on 01/21/16  POCT CBC  Result Value Ref Range   WBC 3.0 (A) 4.6 - 10.2 K/uL   Lymph, poc 0.7 0.6 - 3.4   POC LYMPH PERCENT 24.7 10 - 50 %L   MID (cbc) 0.3 0 - 0.9   POC MID % 8.8 0 - 12 %M   POC Granulocyte 2.0 2 - 6.9   Granulocyte percent 66.5 37 - 80 %G    RBC 4.43 (A) 4.69 - 6.13 M/uL   Hemoglobin 13.9 (A) 14.1 - 18.1 g/dL   HCT, POC 09.8 (A) 11.9 - 53.7 %   MCV 87.9 80 - 97 fL   MCH, POC 31.3 (A) 27 - 31.2 pg   MCHC 35.7 (A) 31.8 - 35.4 g/dL   RDW, POC 14.7 %   Platelet Count, POC 220 142 - 424 K/uL   MPV 6.3 0 - 99.8 fL  POCT urinalysis dipstick  Result Value Ref Range   Color, UA yellow yellow   Clarity, UA clear clear   Glucose, UA negative negative   Bilirubin, UA negative negative   Ketones, POC UA negative negative   Spec Grav, UA <=1.005    Blood, UA negative negative   pH, UA 6.5    Protein Ur, POC negative negative   Urobilinogen, UA 0.2    Nitrite, UA Negative Negative   Leukocytes, UA Negative Negative  POCT glucose (manual entry)  Result Value Ref Range   POC Glucose 84 70 - 99 mg/dl   Orthostatic VS for the past 24 hrs (Last 3 readings):  BP- Lying Pulse- Lying BP- Sitting Pulse- Sitting BP- Standing at 0 minutes Pulse- Standing at 0 minutes  01/21/16 0915 120/77 (!) 48 127/75 51 122/70 50     Assessment and Plan: Victor Simmons is a 60 y.o. male who is here today for dehydration.   Advised to split the toprol in half.given muscle relaxer today though this may not be efficaceous.   Increase hydration to 4 regular water bottles.   Muscle spasms of both lower extremities - Plan: COMPLETE METABOLIC PANEL WITH GFR, POCT CBC, POCT urinalysis dipstick, POCT glucose (manual entry), TSH, methocarbamol (ROBAXIN) 500 MG tablet  Poor nutrition - Plan: COMPLETE METABOLIC PANEL WITH GFR, POCT CBC, POCT urinalysis dipstick, POCT glucose (manual entry), TSH  Loss of weight - Plan: TSH  Nausea and vomiting, intractability of vomiting not specified, unspecified vomiting type - Plan: ondansetron (ZOFRAN-ODT) 8 MG disintegrating tablet  Trena Platt, PA-C [facility] 01/21/2016 8:45 AM  I personally performed the services described in this documentation, which was scribed in my  presence. The recorded information has been reviewed and is accurate.

## 2016-02-03 DIAGNOSIS — E871 Hypo-osmolality and hyponatremia: Secondary | ICD-10-CM | POA: Diagnosis not present

## 2016-02-03 DIAGNOSIS — R634 Abnormal weight loss: Secondary | ICD-10-CM | POA: Diagnosis not present

## 2016-02-03 DIAGNOSIS — K4090 Unilateral inguinal hernia, without obstruction or gangrene, not specified as recurrent: Secondary | ICD-10-CM | POA: Diagnosis not present

## 2016-02-03 DIAGNOSIS — I10 Essential (primary) hypertension: Secondary | ICD-10-CM | POA: Diagnosis not present

## 2016-02-03 MED FILL — BACLOFEN 10 MG TABLET: 10 | 20 days supply | Qty: 60 | Fill #0

## 2016-02-03 MED FILL — KLOR-CON M20 TABLET: 20 | 30 days supply | Qty: 60 | Fill #2

## 2016-02-17 MED FILL — MAGNESIUM OXIDE 400 MG TAB: 400 | 30 days supply | Qty: 60 | Fill #1

## 2016-02-17 MED FILL — CARTIA XT 240 MG CAPSULE: 240 | 90 days supply | Qty: 90 | Fill #2

## 2016-02-20 DIAGNOSIS — I10 Essential (primary) hypertension: Secondary | ICD-10-CM | POA: Diagnosis not present

## 2016-02-20 DIAGNOSIS — E876 Hypokalemia: Secondary | ICD-10-CM | POA: Diagnosis not present

## 2016-02-23 ENCOUNTER — Observation Stay (HOSPITAL_COMMUNITY)
Admission: EM | Admit: 2016-02-23 | Discharge: 2016-02-25 | Disposition: A | Discharge status: Home / Self Care | Payer: 59 | Attending: Internal Medicine | Admitting: Internal Medicine

## 2016-02-23 ENCOUNTER — Emergency Department (HOSPITAL_COMMUNITY): Payer: 59

## 2016-02-23 ENCOUNTER — Encounter (HOSPITAL_COMMUNITY): Payer: Self-pay | Admitting: Emergency Medicine

## 2016-02-23 DIAGNOSIS — M545 Low back pain, unspecified: Secondary | ICD-10-CM

## 2016-02-23 DIAGNOSIS — I952 Hypotension due to drugs: Secondary | ICD-10-CM

## 2016-02-23 DIAGNOSIS — N400 Enlarged prostate without lower urinary tract symptoms: Secondary | ICD-10-CM

## 2016-02-23 DIAGNOSIS — M79669 Pain in unspecified lower leg: Secondary | ICD-10-CM

## 2016-02-23 DIAGNOSIS — E440 Moderate protein-calorie malnutrition: Secondary | ICD-10-CM | POA: Diagnosis not present

## 2016-02-23 DIAGNOSIS — M79605 Pain in left leg: Secondary | ICD-10-CM | POA: Diagnosis not present

## 2016-02-23 DIAGNOSIS — E871 Hypo-osmolality and hyponatremia: Secondary | ICD-10-CM | POA: Diagnosis not present

## 2016-02-23 DIAGNOSIS — J439 Emphysema, unspecified: Secondary | ICD-10-CM | POA: Diagnosis not present

## 2016-02-23 DIAGNOSIS — R252 Cramp and spasm: Secondary | ICD-10-CM | POA: Diagnosis present

## 2016-02-23 DIAGNOSIS — M5432 Sciatica, left side: Secondary | ICD-10-CM

## 2016-02-23 DIAGNOSIS — R0602 Shortness of breath: Secondary | ICD-10-CM | POA: Diagnosis not present

## 2016-02-23 DIAGNOSIS — I10 Essential (primary) hypertension: Secondary | ICD-10-CM | POA: Diagnosis not present

## 2016-02-23 LAB — NA AND K (SODIUM & POTASSIUM), RAND UR
Potassium Urine: 17 mmol/L
Sodium, Ur: 38 mmol/L

## 2016-02-23 LAB — COMPREHENSIVE METABOLIC PANEL
ALBUMIN: 4.2 g/dL (ref 3.5–5.0)
ALT: 18 U/L (ref 17–63)
AST: 24 U/L (ref 15–41)
Alkaline Phosphatase: 78 U/L (ref 38–126)
Anion gap: 8 (ref 5–15)
BUN: 10 mg/dL (ref 6–20)
CHLORIDE: 86 mmol/L — AB (ref 101–111)
CO2: 25 mmol/L (ref 22–32)
Calcium: 9.5 mg/dL (ref 8.9–10.3)
Creatinine, Ser: 0.84 mg/dL (ref 0.61–1.24)
GFR calc Af Amer: >60 mL/min (ref >60)
GFR calc non Af Amer: >60 mL/min (ref >60)
GLUCOSE: 95 mg/dL (ref 65–99)
POTASSIUM: 4.3 mmol/L (ref 3.5–5.1)
Sodium: 119 mmol/L — CL (ref 135–145)
Total Bilirubin: 1.3 mg/dL — ABNORMAL HIGH (ref 0.3–1.2)
Total Protein: 7.6 g/dL (ref 6.5–8.1)

## 2016-02-23 LAB — CBC WITH DIFFERENTIAL/PLATELET
BASOS ABS: 0 10*3/uL (ref 0.0–0.1)
BASOS PCT: 0 %
EOS PCT: 0 %
Eosinophils Absolute: 0 10*3/uL (ref 0.0–0.7)
HEMATOCRIT: 33.5 % — AB (ref 39.0–52.0)
Hemoglobin: 11.9 g/dL — ABNORMAL LOW (ref 13.0–17.0)
Lymphocytes Relative: 15 %
Lymphs Abs: 0.5 10*3/uL — ABNORMAL LOW (ref 0.7–4.0)
MCH: 30.7 pg (ref 26.0–34.0)
MCHC: 35.5 g/dL (ref 30.0–36.0)
MCV: 86.6 fL (ref 78.0–100.0)
MONO ABS: 0.4 10*3/uL (ref 0.1–1.0)
Monocytes Relative: 10 %
NEUTROS ABS: 2.7 10*3/uL (ref 1.7–7.7)
Neutrophils Relative %: 75 %
PLATELETS: 229 10*3/uL (ref 150–400)
RBC: 3.87 MIL/uL — ABNORMAL LOW (ref 4.22–5.81)
RDW: 12.6 % (ref 11.5–15.5)
WBC: 3.6 10*3/uL — ABNORMAL LOW (ref 4.0–10.5)

## 2016-02-23 LAB — MAGNESIUM: Magnesium: 1.8 mg/dL (ref 1.7–2.4)

## 2016-02-23 LAB — CREATININE, SERUM: CREATININE: 0.86 mg/dL (ref 0.61–1.24)

## 2016-02-23 LAB — IRON AND TIBC
Iron: 100 ug/dL (ref 45–182)
SATURATION RATIOS: 35 % (ref 17.9–39.5)
TIBC: 288 ug/dL (ref 250–450)
UIBC: 188 ug/dL

## 2016-02-23 LAB — CBC
HCT: 32.5 % — ABNORMAL LOW (ref 39.0–52.0)
Hemoglobin: 11.5 g/dL — ABNORMAL LOW (ref 13.0–17.0)
MCH: 30.6 pg (ref 26.0–34.0)
MCHC: 35.4 g/dL (ref 30.0–36.0)
MCV: 86.4 fL (ref 78.0–100.0)
PLATELETS: 220 10*3/uL (ref 150–400)
RBC: 3.76 MIL/uL — ABNORMAL LOW (ref 4.22–5.81)
RDW: 12.6 % (ref 11.5–15.5)
WBC: 4.1 10*3/uL (ref 4.0–10.5)

## 2016-02-23 LAB — CK: Total CK: 173 U/L (ref 49–397)

## 2016-02-23 LAB — OSMOLALITY: Osmolality: 251 mOsm/kg — ABNORMAL LOW (ref 275–295)

## 2016-02-23 LAB — TSH: TSH: 0.382 u[IU]/mL (ref 0.350–4.500)

## 2016-02-23 LAB — FERRITIN: FERRITIN: 162 ng/mL (ref 24–336)

## 2016-02-23 LAB — OSMOLALITY, URINE: OSMOLALITY UR: 150 mosm/kg — AB (ref 300–900)

## 2016-02-23 LAB — TRANSFERRIN: TRANSFERRIN: 206 mg/dL (ref 180–329)

## 2016-02-23 LAB — T4, FREE: FREE T4: 1.57 ng/dL — AB (ref 0.61–1.12)

## 2016-02-23 MED ORDER — ACETAMINOPHEN 650 MG RE SUPP
650.0000 mg | Freq: Four times a day (QID) | RECTAL | Status: DC | PRN
Start: 2016-02-23 — End: 2016-02-25

## 2016-02-23 MED ORDER — BACLOFEN 10 MG PO TABS
10.0000 mg | ORAL_TABLET | Freq: Three times a day (TID) | ORAL | Status: DC
  Administered 2016-02-23 – 2016-02-25 (×7): 10 mg via ORAL
  Filled 2016-02-23 (×7): qty 1

## 2016-02-23 MED ORDER — ONDANSETRON HCL 4 MG/2ML IJ SOLN: 4.0000 mg | Freq: Four times a day (QID) | INTRAMUSCULAR | Status: DC | PRN

## 2016-02-23 MED ORDER — SODIUM CHLORIDE 0.9 % IV SOLN: 250.0000 mL | INTRAVENOUS | Status: DC | PRN

## 2016-02-23 MED ORDER — ONDANSETRON HCL 4 MG PO TABS: 4.0000 mg | ORAL_TABLET | Freq: Four times a day (QID) | ORAL | Status: DC | PRN

## 2016-02-23 MED ORDER — DILTIAZEM HCL ER COATED BEADS 120 MG PO CP24
120.0000 mg | ORAL_CAPSULE | Freq: Every day | ORAL | Status: DC
  Administered 2016-02-24 – 2016-02-25 (×2): 120 mg via ORAL
  Filled 2016-02-23 (×2): qty 1

## 2016-02-23 MED ORDER — IBUPROFEN 200 MG PO TABS
400.0000 mg | ORAL_TABLET | Freq: Three times a day (TID) | ORAL | Status: DC
  Administered 2016-02-23 – 2016-02-25 (×7): 400 mg via ORAL
  Filled 2016-02-23 (×7): qty 2

## 2016-02-23 MED ORDER — TAMSULOSIN HCL 0.4 MG PO CAPS
0.4000 mg | ORAL_CAPSULE | Freq: Every day | ORAL | Status: DC
  Administered 2016-02-24 – 2016-02-25 (×2): 0.4 mg via ORAL
  Filled 2016-02-23 (×2): qty 1

## 2016-02-23 MED ORDER — SODIUM CHLORIDE 0.9% FLUSH
3.0000 mL | Freq: Two times a day (BID) | INTRAVENOUS | Status: DC
  Administered 2016-02-23 – 2016-02-25 (×5): 3 mL via INTRAVENOUS

## 2016-02-23 MED ORDER — ENOXAPARIN SODIUM 40 MG/0.4ML ~~LOC~~ SOLN
40.0000 mg | SUBCUTANEOUS | Status: DC
  Administered 2016-02-23 – 2016-02-25 (×3): 40 mg via SUBCUTANEOUS
  Filled 2016-02-23 (×3): qty 0.4

## 2016-02-23 MED ORDER — PANTOPRAZOLE SODIUM 40 MG PO TBEC
40.0000 mg | DELAYED_RELEASE_TABLET | Freq: Every day | ORAL | Status: DC
  Administered 2016-02-23 – 2016-02-25 (×3): 40 mg via ORAL
  Filled 2016-02-23 (×3): qty 1

## 2016-02-23 MED ORDER — OXYCODONE-ACETAMINOPHEN 5-325 MG PO TABS
1.0000 | ORAL_TABLET | ORAL | Status: DC | PRN
  Administered 2016-02-23: 1 via ORAL
  Filled 2016-02-23: qty 1

## 2016-02-23 MED ORDER — ACETAMINOPHEN 325 MG PO TABS: 650.0000 mg | ORAL_TABLET | Freq: Four times a day (QID) | ORAL | Status: DC | PRN

## 2016-02-23 MED ORDER — SODIUM CHLORIDE 0.9 % IV SOLN
INTRAVENOUS | Status: DC
  Administered 2016-02-23: 09:00:00 via INTRAVENOUS

## 2016-02-23 MED ORDER — SODIUM CHLORIDE 0.9% FLUSH: 3.0000 mL | INTRAVENOUS | Status: DC | PRN

## 2016-02-23 MED ORDER — MAGNESIUM OXIDE 400 (241.3 MG) MG PO TABS
400.0000 mg | ORAL_TABLET | Freq: Two times a day (BID) | ORAL | Status: DC
  Administered 2016-02-24 – 2016-02-25 (×4): 400 mg via ORAL
  Filled 2016-02-23 (×4): qty 1

## 2016-02-23 NOTE — ED Notes (Signed)
In addition to triage note, Dr. August Saucerean states that he suspects patient to have undiagnosed malignancy due to recent weight loss, hyponatremia, and weakness.

## 2016-02-23 NOTE — ED Notes (Signed)
MD at bedside. 

## 2016-02-23 NOTE — H&P (Signed)
History and Physical    Victor Simmons ZOX:[id number] DOB: Sep 10, 1955 DOA: 02/23/2016  PCP: Gwenyth Bender, MD   Patient coming from: Home  Chief Complaint: Left lower extremity pain and abnormal sodium.   HPI: Victor Simmons is a 60 y.o. male with medical history significant of lower back pain who presents to hospital with the chief complaint of persistent back pain and abnormal sodium levels. 48 hours ago he was seen by his primary care provider, hyponatremia was diagnosed and he was advised to come to the hospital. He decided to wait until today. Apparently about a week ago he was encouraged to increase his free water intake due to dehydration, he has been drinking about 32 ounces daily of free water, denies any nausea, vomiting or diarrhea. No new medication added to his regimen. He complains of back pain predominantly in the lower region on the left side, intermittent, moderate to severe intensity, sharp in nature, worse in the early morning, improved with by mouth analgesics, radiates down to the left lower extremity down to the toes. He has felt paresthesias on his left foot associated with this lower back/left lower extremity pain. He does have a right inguinal hernia.    ED Course: IV fluids  Review of Systems:  1. General. No fevers or chills, no waking of weight loss 2. Cardiovascular, no angina, claudication, no PND orthopnea 3. Pulmonary shortness of breath cough or hemoptysis 4. Gastrointestinal no nausea vomiting or diarrhea 5. Muscular skeletal positive for lower back pain and left lower extremity pain as mentioned in history present illness 6. Dermatology no rashes 7. Urology no dysuria or increased urinary frequency 8. Hematology no easy bruisability or frequent infections 9. Psych no anxiety or depression 10. Neurology no seizures, positive paresthesias intermittent on his left foot.  Past Medical History:  Diagnosis Date  . Blood transfusion without reported diagnosis   .  Hypertension     Past Surgical History:  Procedure Laterality Date  . ulcer surgery       reports that he has quit smoking. His smoking use included Cigarettes. He has a 15.00 pack-year smoking history. He has never used smokeless tobacco. He reports that he drinks about 4.1 oz of alcohol per week . He reports that he uses drugs, including Marijuana.  Allergies  Allergen Reactions  . Aspirin Other (See Comments)    Does not dissolve in digestive tract    History reviewed. No pertinent family history.   Prior to Admission medications   Medication Sig Start Date End Date Taking? Authorizing Provider  baclofen (LIORESAL) 10 MG tablet Take 10 mg by mouth 3 (three) times daily.   Yes Historical Provider, MD  diltiazem (CARDIZEM CD) 240 MG 24 hr capsule Take 240 mg by mouth every morning.    Yes Historical Provider, MD  losartan-hydrochlorothiazide (HYZAAR) 100-12.5 MG tablet Take 1 tablet by mouth every morning.    Yes Historical Provider, MD  magnesium oxide (MAG-OX) 400 MG tablet Take 400 mg by mouth 2 (two) times daily.   Yes Historical Provider, MD  potassium chloride SA (K-DUR,KLOR-CON) 20 MEQ tablet Take 20 mEq by mouth 2 (two) times daily.   Yes Historical Provider, MD  tamsulosin (FLOMAX) 0.4 MG CAPS capsule Take 0.4 mg by mouth daily after breakfast.    Yes Historical Provider, MD    Physical Exam: Vitals:   02/23/16 0725 02/23/16 0900 02/23/16 0950 02/23/16 1019  BP:    90/64  Pulse:      Resp:  15 18   Temp:      SpO2:      Weight: 64.1 kg (141 lb 6.4 oz)     Height: 6\' 1"  (1.854 m)         Constitutional: Not in pain or dyspnea. Vitals:   02/23/16 0725 02/23/16 0900 02/23/16 0950 02/23/16 1019  BP:    90/64  Pulse:      Resp:  15 18   Temp:      SpO2:      Weight: 64.1 kg (141 lb 6.4 oz)     Height: 6\' 1"  (1.854 m)      Eyes: PERRL, lids and conjunctivae normal ENMT: Mucous membranes are moist. Posterior pharynx clear of any exudate or lesions.Normal  dentition.  Neck: normal, supple, no masses, no thyromegaly Respiratory: clear to auscultation bilaterally, no wheezing, no crackles. Normal respiratory effort. No accessory muscle use.  Cardiovascular: Regular rate and rhythm, no murmurs / rubs / gallops. No extremity edema. 2+ pedal pulses. No carotid bruits.  Abdomen: no tenderness, no masses palpated. No hepatosplenomegaly. Bowel sounds positive.  Musculoskeletal: no clubbing / cyanosis. No joint deformity upper and lower extremities. Good ROM, no contractures. Normal muscle tone. Pain to palpation on the posterior region of his left lower extremity. Skin: no rashes, lesions, ulcers. No induration Neurologic: CN 2-12 grossly intact. Sensation intact, DTR normal. Strength 5/5 in all 4.      Labs on Admission: I have personally reviewed following labs and imaging studies  CBC:  Recent Labs Lab 02/23/16 0900  WBC 3.6*  NEUTROABS 2.7  HGB 11.9*  HCT 33.5*  MCV 86.6  PLT 229   Basic Metabolic Panel:  Recent Labs Lab 02/23/16 0900  NA 119*  K 4.3  CL 86*  CO2 25  GLUCOSE 95  BUN 10  CREATININE 0.84  CALCIUM 9.5  MG 1.8   GFR: Estimated Creatinine Clearance: 84.8 mL/min (by C-G formula based on SCr of 0.84 mg/dL). Liver Function Tests:  Recent Labs Lab 02/23/16 0900  AST 24  ALT 18  ALKPHOS 78  BILITOT 1.3*  PROT 7.6  ALBUMIN 4.2   No results for input(s): LIPASE, AMYLASE in the last 168 hours. No results for input(s): AMMONIA in the last 168 hours. Coagulation Profile: No results for input(s): INR, PROTIME in the last 168 hours. Cardiac Enzymes:  Recent Labs Lab 02/23/16 0900  CKTOTAL 173   BNP (last 3 results) No results for input(s): PROBNP in the last 8760 hours. HbA1C: No results for input(s): HGBA1C in the last 72 hours. CBG: No results for input(s): GLUCAP in the last 168 hours. Lipid Profile: No results for input(s): CHOL, HDL, LDLCALC, TRIG, CHOLHDL, LDLDIRECT in the last 72  hours. Thyroid Function Tests: No results for input(s): TSH, T4TOTAL, FREET4, T3FREE, THYROIDAB in the last 72 hours. Anemia Panel: No results for input(s): VITAMINB12, FOLATE, FERRITIN, TIBC, IRON, RETICCTPCT in the last 72 hours. Urine analysis:    Component Value Date/Time   COLORURINE YELLOW 05/20/2011 2346   APPEARANCEUR CLOUDY (A) 05/20/2011 2346   LABSPEC 1.010 05/20/2011 2346   PHURINE 5.5 05/20/2011 2346   GLUCOSEU NEGATIVE 05/20/2011 2346   HGBUR SMALL (A) 05/20/2011 2346   BILIRUBINUR negative 01/21/2016 0940   KETONESUR negative 01/21/2016 0940   KETONESUR NEGATIVE 05/20/2011 2346   PROTEINUR negative 01/21/2016 0940   PROTEINUR NEGATIVE 05/20/2011 2346   UROBILINOGEN 0.2 01/21/2016 0940   UROBILINOGEN 0.2 05/20/2011 2346   NITRITE Negative 01/21/2016 0940   NITRITE NEGATIVE  05/20/2011 2346   LEUKOCYTESUR Negative 01/21/2016 0940   Sepsis Labs: !!!!!!!!!!!!!!!!!!!!!!!!!!!!!!!!!!!!!!!!!!!! @LABRCNTIP (procalcitonin:4,lacticidven:4) )No results found for this or any previous visit (from the past 240 hour(s)).   Radiological Exams on Admission: Dg Chest 2 View  Result Date: 02/23/2016 CLINICAL DATA:  Bilateral leg cramping.  Hypertension. EXAM: CHEST  2 VIEW COMPARISON:  May 20, 2011 FINDINGS: The heart size and mediastinal contours are within normal limits. There is no focal infiltrate, pulmonary edema, or pleural effusion. The lungs are hyperinflated. The visualized skeletal structures are stable. IMPRESSION: No active cardiopulmonary disease.  Emphysema. Electronically Signed   By: Sherian ReinWei-Chen  Lin M.D.   On: 02/23/2016 09:44    EKG: Independently reviewed. Normal sinus rhythm with a heart rate of 45 beats per minute, has a concave ST elevations in the inferior leads (chronic compared to old EKG)   Chest. Personal review AP and lateral films, showing hyperinflation, no infiltrates or effusions.  Assessment/Plan Active Problems:   Hyponatremia   This is a  60 year old male who comes to the hospital with the chief complaint of back pain radiated to his left lower extremity and hyponatremia. He has been encouraged to increase his free water intake for last week, taking about 32 ounces of free water daily, denies any other GI symptoms including nausea, vomiting or diarrhea. His by mouth intake has been appropriate. On the physical examination he is  euvolemic, blood pressure 90/54, heart rate 60, respiratory 16, oxygen saturation percent on room air. His oral mucosa is moist, his lungs are clear to auscultation, heart S1-S2 present, bradycardic, abdomen soft nontender, he does have pain at the posterior part of his left thigh. Strength and sensation is intact. His serum sodium is 119, it was 124 on 01/21/2016, his potassium is 4.3, BUN 10, creatinine 0.84, glucose 95, white count 3.6, hemoglobin 11.9, hematocrit 33.5, platelet count is 229. His TSH was elevated on August 1.   Working diagnosis. Normo-volemic hyponatremia, complicated by hypotension and left sciatica.  1. Hyponatremia. Further workup with  serum osmolarity, urine osmolarity, urinary sodium and potassium. Will stop IV fluids and will place patient on fluid restriction. Will follow-up on kidney function and electrolytes in the morning. Will recheck TSH and free T4.   2. Hypotension. Will hold patient's antihypertensive agents, losartan/hydrochlorothiazide. Will use bolus of normal saline if need to to target a mean arterial pressure of 65. Hypotension suspected to be medication induced.  3. Sciatica. For last 2 months ongoing, will continue pain control with anti-inflammatory agents, opiates for severe pain, physical therapy evaluation. If persistent and debilitating pain will consider further imaging studies. Continue muscle relaxant with baclofen.   4. Bradycardia. Sinus bradycardia, will decrease diltiazem from 240 mg daily 120 g daily. Continue monitoring of heart rate. Concave ST elevations  probably due to repolarization abnormalities that seem to be chronic, no criteria for LVH per EKG parameters.  5. BPH. Continue Flomax   6. Leukopenia. Will follow cell count in the morning, he also does have anemia, will check iron stores.   Patient continued high risk of developing worsening hyponatremia.     DVT prophylaxis: lovenox Code Status:  full Family Communication: no family at the bedside  Disposition Plan: home  Consults called: none  Admission status: Observation   Victor Simmons Annett Gulaaniel Kolter Reaver MD [facility] Pager [phone]  If 7PM-7AM, please contact night-coverage [email or web address] Password [email or web address]  02/23/2016, 10:53 AM

## 2016-02-23 NOTE — ED Provider Notes (Signed)
[facility]-EMERGENCY DEPT Provider Note   CSN: [id number] Arrival date & time: 02/23/16  0707     History   Chief Complaint Chief Complaint  Patient presents with  . Spasms    hyponatremia    HPI Victor Simmons is a 60 y.o. male.  60 year old male presents with two-month history of bilateral lower extremity weakness. States his symptoms started in his calves and extended down to bilateral feet. Denies any back pain. No bowel or bladder dysfunction. Denies any headaches. No upper extremity weakness. Saw his doctor for this on August 1 and blood work shows that he was hyponatremic. Started on medication for this. Does have some trouble with ambulation at this time. Saw his PCP this past week after blood work drawn on Thursday showed some electrolyte abnormalities. His physician wanted him to the hospital 2 days ago but patient deferred. Denies any falls. Nothing makes his symptoms better. Denies any rashes.      Past Medical History:  Diagnosis Date  . Blood transfusion without reported diagnosis   . Hypertension     Patient Active Problem List   Diagnosis Date Noted  . PNA (pneumonia) 05/21/2011  . Hyponatremia 05/21/2011  . HTN (hypertension) 05/21/2011    Past Surgical History:  Procedure Laterality Date  . ulcer surgery         Home Medications    Prior to Admission medications   Medication Sig Start Date End Date Taking? Authorizing Provider  diltiazem (CARDIZEM CD) 360 MG 24 hr capsule Take 360 mg by mouth daily.      Historical Provider, MD  doxazosin (CARDURA) 8 MG tablet Take 4 mg by mouth at bedtime.      Historical Provider, MD  losartan-hydrochlorothiazide (HYZAAR) 100-12.5 MG tablet Take 1 tablet by mouth daily.    Historical Provider, MD  metoprolol succinate (TOPROL-XL) 25 MG 24 hr tablet Take 25 mg by mouth daily.      Historical Provider, MD  potassium chloride SA (K-DUR,KLOR-CON) 20 MEQ tablet Take 20 mEq by mouth 2 (two) times daily.    Historical  Provider, MD  tamsulosin (FLOMAX) 0.4 MG CAPS capsule Take 0.4 mg by mouth.    Historical Provider, MD    Family History History reviewed. No pertinent family history.  Social History Social History  Substance Use Topics  . Smoking status: Former Smoker    Packs/day: 0.50    Years: 30.00    Types: Cigarettes  . Smokeless tobacco: Never Used  . Alcohol use 4.1 oz/week    6 Cans of beer, 1 Standard drinks or equivalent per week     Comment: occassionally     Allergies   Aspirin   Review of Systems Review of Systems  All other systems reviewed and are negative.    Physical Exam Updated Vital Signs BP (!) 99/54 (BP Location: Left Arm)   Pulse 60   Temp 98.6 F (37 C)   Resp 15   Ht 6\' 1"  (1.854 m)   Wt 64.1 kg   SpO2 100%   BMI 18.66 kg/m   Physical Exam  Constitutional: He is oriented to person, place, and time. He appears well-developed and well-nourished.  Non-toxic appearance. No distress.  HENT:  Head: Normocephalic and atraumatic.  Eyes: Conjunctivae, EOM and lids are normal. Pupils are equal, round, and reactive to light.  Neck: Normal range of motion. Neck supple. No tracheal deviation present. No thyroid mass present.  Cardiovascular: Normal rate, regular rhythm and normal heart sounds.  Exam  reveals no gallop.   No murmur heard. Pulmonary/Chest: Effort normal and breath sounds normal. No stridor. No respiratory distress. He has no decreased breath sounds. He has no wheezes. He has no rhonchi. He has no rales.  Abdominal: Soft. Normal appearance and bowel sounds are normal. He exhibits no distension. There is no tenderness. There is no rebound and no CVA tenderness.  Musculoskeletal: Normal range of motion. He exhibits no edema or tenderness.  Neurological: He is alert and oriented to person, place, and time. He displays atrophy. He displays no tremor. No cranial nerve deficit or sensory deficit. GCS eye subscore is 4. GCS verbal subscore is 5. GCS motor  subscore is 6.  Reflex Scores:      Patellar reflexes are 2+ on the right side and 2+ on the left side. Skin: Skin is warm and dry. No abrasion and no rash noted.  Psychiatric: He has a normal mood and affect. His speech is normal and behavior is normal.  Nursing note and vitals reviewed.    ED Treatments / Results  Labs (all labs ordered are listed, but only abnormal results are displayed) Labs Reviewed  CBC WITH DIFFERENTIAL/PLATELET  COMPREHENSIVE METABOLIC PANEL  MAGNESIUM  CK    EKG  EKG Interpretation  Date/Time:  Sunday February 23 2016 08:49:28 EDT Ventricular Rate:  45 PR Interval:    QRS Duration: 104 QT Interval:  453 QTC Calculation: 392 R Axis:   82 Text Interpretation:  Sinus bradycardia Borderline right axis deviation ST elevation, consider inferior injury No significant change since last tracing Confirmed by Beniah Magnan  MD, Elizar Alpern ([id number]) on 02/23/2016 9:21:04 AM       Radiology No results found.  Procedures Procedures (including critical care time)  Medications Ordered in ED Medications  0.9 %  sodium chloride infusion ( Intravenous New Bag/Given 02/23/16 0904)     Initial Impression / Assessment and Plan / ED Course  I have reviewed the triage vital signs and the nursing notes.  Pertinent labs & imaging results that were available during my care of the patient were reviewed by me and considered in my medical decision making (see chart for details).  Clinical Course    Patient with evidence of hyponatremia with a sodium of 119. Will be admitted to the hospital for further management  Final Clinical Impressions(s) / ED Diagnoses   Final diagnoses:  None    New Prescriptions New Prescriptions   No medications on file     Lorre Nick, MD 02/23/16 [phone]

## 2016-02-23 NOTE — ED Triage Notes (Signed)
60 yo male presents with cramping in buttock area bilaterally with radiation down to legs bilaterally for 2 months.  Pt c/o of numbness in feet bilaterally also.  Pt saw his PCP for issue and was given potassium, magnesium oxide and baclofen for problem without relief.  Patient appears stiff when walking and states it even makes his gait "off".  Patient had bloodwork done but is not specific with details of results.

## 2016-02-24 ENCOUNTER — Observation Stay (HOSPITAL_COMMUNITY): Payer: 59

## 2016-02-24 DIAGNOSIS — I10 Essential (primary) hypertension: Secondary | ICD-10-CM | POA: Diagnosis not present

## 2016-02-24 DIAGNOSIS — E440 Moderate protein-calorie malnutrition: Secondary | ICD-10-CM | POA: Diagnosis not present

## 2016-02-24 DIAGNOSIS — E871 Hypo-osmolality and hyponatremia: Secondary | ICD-10-CM

## 2016-02-24 DIAGNOSIS — M47816 Spondylosis without myelopathy or radiculopathy, lumbar region: Secondary | ICD-10-CM | POA: Diagnosis not present

## 2016-02-24 DIAGNOSIS — R0602 Shortness of breath: Secondary | ICD-10-CM | POA: Diagnosis not present

## 2016-02-24 DIAGNOSIS — M79605 Pain in left leg: Secondary | ICD-10-CM | POA: Diagnosis not present

## 2016-02-24 LAB — BASIC METABOLIC PANEL
Anion gap: 7 (ref 5–15)
Anion gap: 5 (ref 5–15)
Anion gap: 6 (ref 5–15)
Anion gap: 5 (ref 5–15)
BUN: 11 mg/dL (ref 6–20)
BUN: 11 mg/dL (ref 6–20)
BUN: 12 mg/dL (ref 6–20)
BUN: 14 mg/dL (ref 6–20)
CALCIUM: 9 mg/dL (ref 8.9–10.3)
CALCIUM: 8.9 mg/dL (ref 8.9–10.3)
CALCIUM: 8.9 mg/dL (ref 8.9–10.3)
CHLORIDE: 89 mmol/L — AB (ref 101–111)
CHLORIDE: 91 mmol/L — AB (ref 101–111)
CO2: 26 mmol/L (ref 22–32)
CO2: 26 mmol/L (ref 22–32)
CO2: 24 mmol/L (ref 22–32)
CO2: 24 mmol/L (ref 22–32)
CREATININE: 0.85 mg/dL (ref 0.61–1.24)
CREATININE: 0.9 mg/dL (ref 0.61–1.24)
CREATININE: 0.99 mg/dL (ref 0.61–1.24)
CREATININE: 0.89 mg/dL (ref 0.61–1.24)
Calcium: 9.3 mg/dL (ref 8.9–10.3)
Chloride: 93 mmol/L — ABNORMAL LOW (ref 101–111)
Chloride: 93 mmol/L — ABNORMAL LOW (ref 101–111)
GFR calc non Af Amer: >60 mL/min (ref >60)
GFR calc non Af Amer: >60 mL/min (ref >60)
GFR calc non Af Amer: >60 mL/min (ref >60)
Glucose, Bld: 110 mg/dL — ABNORMAL HIGH (ref 65–99)
Glucose, Bld: 94 mg/dL (ref 65–99)
Glucose, Bld: 111 mg/dL — ABNORMAL HIGH (ref 65–99)
Glucose, Bld: 96 mg/dL (ref 65–99)
Potassium: 4.5 mmol/L (ref 3.5–5.1)
Potassium: 4.2 mmol/L (ref 3.5–5.1)
Potassium: 4 mmol/L (ref 3.5–5.1)
Potassium: 4 mmol/L (ref 3.5–5.1)
SODIUM: 122 mmol/L — AB (ref 135–145)
SODIUM: 122 mmol/L — AB (ref 135–145)
SODIUM: 123 mmol/L — AB (ref 135–145)
SODIUM: 122 mmol/L — AB (ref 135–145)

## 2016-02-24 LAB — COMPREHENSIVE METABOLIC PANEL
ALT: 14 U/L — AB (ref 17–63)
ANION GAP: 5 (ref 5–15)
AST: 18 U/L (ref 15–41)
Albumin: 3.5 g/dL (ref 3.5–5.0)
Alkaline Phosphatase: 62 U/L (ref 38–126)
BUN: 10 mg/dL (ref 6–20)
CHLORIDE: 90 mmol/L — AB (ref 101–111)
CO2: 26 mmol/L (ref 22–32)
CREATININE: 0.98 mg/dL (ref 0.61–1.24)
Calcium: 8.9 mg/dL (ref 8.9–10.3)
Glucose, Bld: 95 mg/dL (ref 65–99)
POTASSIUM: 4 mmol/L (ref 3.5–5.1)
Sodium: 121 mmol/L — ABNORMAL LOW (ref 135–145)
Total Bilirubin: 1 mg/dL (ref 0.3–1.2)
Total Protein: 5.9 g/dL — ABNORMAL LOW (ref 6.5–8.1)

## 2016-02-24 MED ORDER — SODIUM CHLORIDE 1 G PO TABS
1.0000 g | ORAL_TABLET | Freq: Three times a day (TID) | ORAL | Status: DC
  Administered 2016-02-24 (×3): 1 g via ORAL
  Filled 2016-02-24 (×4): qty 1

## 2016-02-24 NOTE — Progress Notes (Signed)
[facility] Progress Note  Patient: Victor Simmons:[id number]   PCP: Gwenyth BenderEric L Dean, MD DOB: 07/07/1955   DOA: 02/23/2016   DOS: 02/24/2016   Date of Service: the patient was seen and examined on 02/24/2016  Subjective: Patient complains about leg spasms after walking a few feet distance. Denies any dizziness or lightheadedness. He had been drinking too much fluid over last few days. Nutrition: Tolerating oral diet  Brief hospital course: Pt. with PMH of Essential hypertension; admitted on 02/23/2016, with complaint of Leg pain and back pain, was found to have Hyponatremia. Currently further plan is Continue fluid restricted diet.  Assessment and Plan: 1. Hypoosmolar  Hyponatremia. Euvolemic on clinical examination. Possibly primary polydipsia with possibility of involvement of HCTZ. Discontinue HCTZ, continue fluid restricted diet. Monitor BMP every 4 hours. Salt tablets  2.Essential hypertension. Blood pressure at present stable. No orthostatic drop. We'll continue to monitor without any blood pressure medication.  3.low back pain and calf pain pn ambulation Patient has lower back pain as well as muscle spasm after ambulating a few distance. Lumbar x-ray does not show any acute abnormality. Continue current regimen PT Recommendation is for outpatient PT.  Pain management: PRN percocet,  Activity: Consultedphysical therapy Bowel regimen: last BM Prior to admission Diet: Regular diet with fluid restriction of 1000 mL DVT Prophylaxis: subcutaneous Heparin  Advance goals of care discussion: Full code  Family Communication: no family was present at bedside, at the time of interview.  Disposition:  Discharge to home. Expected discharge date: 02/25/2016, Improvement in sodium  Consultants: none Procedures: none  Antibiotics: Anti-infectives    None        Intake/Output Summary (Last 24 hours) at 02/24/16 1627 Last data filed at 02/24/16 1229  Gross per 24 hour  Intake              1320 ml  Output              750 ml  Net              570 ml   Filed Weights   02/23/16 0725 02/23/16 1124  Weight: 64.1 kg (141 lb 6.4 oz) 62.4 kg (137 lb 9.1 oz)    Objective: Physical Exam: Vitals:   02/23/16 1400 02/23/16 2059 02/24/16 0515 02/24/16 1307  BP: 128/69 97/64 114/70 115/73  Pulse: (!) 43 (!) 49 60 70  Resp:  18 18 18   Temp: 98.4 F (36.9 C) 98.4 F (36.9 C) 98 F (36.7 C) 98.4 F (36.9 C)  TempSrc: Oral Oral Oral Oral  SpO2: 100% 100% 99% 98%  Weight:      Height:        General: Alert, Awake and Oriented to Time, Place and Person. Appear in mild distress Eyes: PERRL, Conjunctiva normal ENT: Oral Mucosa clear moist. Neck: no JVD, no Abnormal Mass Or lumps Cardiovascular: S1 and S2 Present, no Murmur, Respiratory: Bilateral Air entry equal and Decreased, Clear to Auscultation, no Crackles, no wheezes Abdomen: Bowel Sound present, Soft and no tenderness Skin: no redness, no Rash  Extremities: no Pedal edema, no calf tenderness Neurologic: Grossly no focal neuro deficit. Bilaterally Equal motor strength Mental status AAOx3, speech normal, attention normal,  Cranial Nerves PERRL, EOM normal and present, facial sensation to light touch present,  Motor strength bilateral equal strength 5/5,  Sensation present to light touch,  Reflexes present knee and biceps, babinski negative,  Cerebellar test normal finger nose finger.  Data Reviewed: CBC:  Recent Labs Lab  02/23/16 0900 02/23/16 1249  WBC 3.6* 4.1  NEUTROABS 2.7  --   HGB 11.9* 11.5*  HCT 33.5* 32.5*  MCV 86.6 86.4  PLT 229 220   Basic Metabolic Panel:  Recent Labs Lab 02/23/16 0900 02/23/16 1249 02/24/16 0523 02/24/16 1045 02/24/16 1444  NA 119*  --  121* 122* 122*  K 4.3  --  4.0 4.5 4.2  CL 86*  --  90* 89* 91*  CO2 25  --  26 26 26   GLUCOSE 95  --  95 110* 94  BUN 10  --  10 11 11   CREATININE 0.84 0.86 0.98 0.85 0.90  CALCIUM 9.5  --  8.9 9.3 9.0  MG 1.8  --   --    --   --     Liver Function Tests:  Recent Labs Lab 02/23/16 0900 02/24/16 0523  AST 24 18  ALT 18 14*  ALKPHOS 78 62  BILITOT 1.3* 1.0  PROT 7.6 5.9*  ALBUMIN 4.2 3.5   No results for input(s): LIPASE, AMYLASE in the last 168 hours. No results for input(s): AMMONIA in the last 168 hours. Coagulation Profile: No results for input(s): INR, PROTIME in the last 168 hours. Cardiac Enzymes:  Recent Labs Lab 02/23/16 0900  CKTOTAL 173   BNP (last 3 results) No results for input(s): PROBNP in the last 8760 hours.  CBG: No results for input(s): GLUCAP in the last 168 hours.  Studies: Dg Lumbar Spine 2-3 Views  Result Date: 02/24/2016 CLINICAL DATA:  Back pain, numbness EXAM: LUMBAR SPINE - 2-3 VIEW COMPARISON:  None. FINDINGS: Five lumbar type vertebral bodies. Normal lumbar lordosis. No evidence of fracture or dislocation. Vertebral body heights are maintained. Mild multilevel degenerative changes, most prominent at L4-5. Visualized bony pelvis appears intact. IMPRESSION: No fracture or dislocation is seen. Mild degenerative changes. Electronically Signed   By: Charline Bills M.D.   On: 02/24/2016 14:17     Scheduled Meds: . baclofen  10 mg Oral TID  . diltiazem  120 mg Oral Daily  . enoxaparin (LOVENOX) injection  40 mg Subcutaneous Q24H  . ibuprofen  400 mg Oral TID  . magnesium oxide  400 mg Oral BID  . pantoprazole  40 mg Oral Daily  . sodium chloride flush  3 mL Intravenous Q12H  . sodium chloride  1 g Oral TID WC  . tamsulosin  0.4 mg Oral QPC breakfast   Continuous Infusions:  PRN Meds: sodium chloride, acetaminophen **OR** acetaminophen, ondansetron **OR** ondansetron (ZOFRAN) IV, oxyCODONE-acetaminophen, sodium chloride flush  Time spent: 30 minutes  Author: Lynden Oxford, MD [facility] Pager: [phone] 02/24/2016 4:27 PM  If 7PM-7AM, please contact night-coverage at [email or web address], password [email or web address]

## 2016-02-24 NOTE — Progress Notes (Signed)
CSW consulted to assist with medication at d/c. CSW is unable to assist with this request. RNCM is aware and may be able to offer assistance.   Cori RazorJamie Deshone Lyssy LCSW [phone]

## 2016-02-24 NOTE — Evaluation (Signed)
Physical Therapy Evaluation Patient Details Name: Victor Simmons MRN: [id number] DOB: 12/05/1955 Today's Date: 02/24/2016   History of Present Illness  60 y.o. male with medical history significant of lower back pain who presents to hospital with the chief complaint of persistent back pain and abnormal sodium levels  Clinical Impression  Pt admitted with above diagnosis. Pt currently with functional limitations due to the deficits listed below (see PT Problem List).  Pt will benefit from skilled PT to increase their independence and safety with mobility to allow discharge to the venue listed below.  Pt ambulated in hallway however with increased distance gait becomes more antalgic and pt reports increase in pain.  Pt agreeable to use SPC (reported 2 falls prior to admission due to intense pain) upon d/c.  Pt reports his PCP is aware of pain however not providing effective treatment, also reports no imaging.  Recommend outpatient PT, if possible, for best outcomes.     Follow Up Recommendations Outpatient PT (best benefit from OP PT)    Equipment Recommendations  Cane    Recommendations for Other Services       Precautions / Restrictions Precautions Precautions: Fall      Mobility  Bed Mobility               General bed mobility comments: pt up in recliner on arrival  Transfers Overall transfer level: Needs assistance Equipment used: None Transfers: Sit to/from Stand Sit to Stand: Supervision            Ambulation/Gait Ambulation/Gait assistance: Min guard Ambulation Distance (Feet): 400 Feet Assistive device: None Gait Pattern/deviations: Step-through pattern;Antalgic;Decreased stance time - right     General Gait Details: antalgic gait observed, pt reports more pain on R LE however also describes radiating pain posterior legs from gluteal fold to lateral feet with increased time on feet  Stairs            Wheelchair Mobility    Modified Rankin (Stroke  Patients Only)       Balance Overall balance assessment: History of Falls (reports 2 falls PTA due to pain in legs)                                           Pertinent Vitals/Pain Pain Assessment: No/denies pain (at rest)    Home Living Family/patient expects to be discharged to:: Private residence Living Arrangements: Alone             Home Equipment: None      Prior Function Level of Independence: Independent               Hand Dominance        Extremity/Trunk Assessment               Lower Extremity Assessment: Overall WFL for tasks assessed         Communication   Communication: No difficulties  Cognition Arousal/Alertness: Awake/alert Behavior During Therapy: WFL for tasks assessed/performed Overall Cognitive Status: Within Functional Limits for tasks assessed                      General Comments      Exercises Other Exercises Other Exercises: educated pt and demonstrated seated hamstring stretch with hold approx 15-20 sec and perform x3 (since pt reports standing stretch however performing incorrectly)      Assessment/Plan  PT Assessment Patient needs continued PT services  PT Diagnosis Acute pain;Abnormality of gait   PT Problem List Decreased mobility;Decreased balance;Decreased knowledge of use of DME;Pain  PT Treatment Interventions DME instruction;Gait training;Functional mobility training;Therapeutic activities;Therapeutic exercise;Patient/family education;Stair training   PT Goals (Current goals can be found in the Care Plan section) Acute Rehab PT Goals PT Goal Formulation: With patient Time For Goal Achievement: 03/02/16 Potential to Achieve Goals: Good    Frequency Min 3X/week   Barriers to discharge        Co-evaluation               End of Session Equipment Utilized During Treatment: Gait belt Activity Tolerance: Patient tolerated treatment well Patient left: in chair;with  call bell/phone within reach;with chair alarm set      Functional Assessment Tool Used: clinical judgement Functional Limitation: Mobility: Walking and moving around Mobility: Walking and Moving Around Current Status [phone]): At least 1 percent but less than 20 percent impaired, limited or restricted Mobility: Walking and Moving Around Goal Status [phone]): 0 percent impaired, limited or restricted    Time: 0955-1004 PT Time Calculation (min) (ACUTE ONLY): 9 min   Charges:   PT Evaluation $PT Eval Low Complexity: 1 Procedure     PT G Codes:   PT G-Codes **NOT FOR INPATIENT CLASS** Functional Assessment Tool Used: clinical judgement Functional Limitation: Mobility: Walking and moving around Mobility: Walking and Moving Around Current Status (F6213): At least 1 percent but less than 20 percent impaired, limited or restricted Mobility: Walking and Moving Around Goal Status [phone]): 0 percent impaired, limited or restricted    Gazella Anglin,KATHrine E 02/24/2016, 12:26 PM Zenovia Jarred, PT, DPT 02/24/2016 Pager: [phone]

## 2016-02-25 DIAGNOSIS — R0602 Shortness of breath: Secondary | ICD-10-CM | POA: Diagnosis not present

## 2016-02-25 DIAGNOSIS — M79605 Pain in left leg: Secondary | ICD-10-CM | POA: Diagnosis not present

## 2016-02-25 DIAGNOSIS — E440 Moderate protein-calorie malnutrition: Secondary | ICD-10-CM | POA: Diagnosis not present

## 2016-02-25 DIAGNOSIS — I10 Essential (primary) hypertension: Secondary | ICD-10-CM | POA: Diagnosis not present

## 2016-02-25 DIAGNOSIS — E871 Hypo-osmolality and hyponatremia: Secondary | ICD-10-CM | POA: Diagnosis not present

## 2016-02-25 LAB — BASIC METABOLIC PANEL
ANION GAP: 6 (ref 5–15)
ANION GAP: 6 (ref 5–15)
ANION GAP: 6 (ref 5–15)
ANION GAP: 6 (ref 5–15)
ANION GAP: 4 — AB (ref 5–15)
BUN: 13 mg/dL (ref 6–20)
BUN: 12 mg/dL (ref 6–20)
BUN: 13 mg/dL (ref 6–20)
BUN: 14 mg/dL (ref 6–20)
BUN: 15 mg/dL (ref 6–20)
CALCIUM: 9.1 mg/dL (ref 8.9–10.3)
CALCIUM: 9 mg/dL (ref 8.9–10.3)
CALCIUM: 8.9 mg/dL (ref 8.9–10.3)
CALCIUM: 9.2 mg/dL (ref 8.9–10.3)
CHLORIDE: 93 mmol/L — AB (ref 101–111)
CHLORIDE: 92 mmol/L — AB (ref 101–111)
CHLORIDE: 94 mmol/L — AB (ref 101–111)
CO2: 25 mmol/L (ref 22–32)
CO2: 25 mmol/L (ref 22–32)
CO2: 25 mmol/L (ref 22–32)
CO2: 27 mmol/L (ref 22–32)
CO2: 27 mmol/L (ref 22–32)
Calcium: 9.1 mg/dL (ref 8.9–10.3)
Chloride: 94 mmol/L — ABNORMAL LOW (ref 101–111)
Chloride: 95 mmol/L — ABNORMAL LOW (ref 101–111)
Creatinine, Ser: 0.95 mg/dL (ref 0.61–1.24)
Creatinine, Ser: 0.89 mg/dL (ref 0.61–1.24)
Creatinine, Ser: 1.01 mg/dL (ref 0.61–1.24)
Creatinine, Ser: 1.02 mg/dL (ref 0.61–1.24)
Creatinine, Ser: 0.89 mg/dL (ref 0.61–1.24)
GFR calc non Af Amer: >60 mL/min (ref >60)
GFR calc non Af Amer: >60 mL/min (ref >60)
GFR calc non Af Amer: >60 mL/min (ref >60)
GFR calc non Af Amer: >60 mL/min (ref >60)
GLUCOSE: 103 mg/dL — AB (ref 65–99)
GLUCOSE: 101 mg/dL — AB (ref 65–99)
GLUCOSE: 118 mg/dL — AB (ref 65–99)
GLUCOSE: 95 mg/dL (ref 65–99)
GLUCOSE: 105 mg/dL — AB (ref 65–99)
POTASSIUM: 3.9 mmol/L (ref 3.5–5.1)
POTASSIUM: 4.2 mmol/L (ref 3.5–5.1)
POTASSIUM: 4.2 mmol/L (ref 3.5–5.1)
POTASSIUM: 4.3 mmol/L (ref 3.5–5.1)
POTASSIUM: 4.4 mmol/L (ref 3.5–5.1)
SODIUM: 124 mmol/L — AB (ref 135–145)
Sodium: 123 mmol/L — ABNORMAL LOW (ref 135–145)
Sodium: 125 mmol/L — ABNORMAL LOW (ref 135–145)
Sodium: 127 mmol/L — ABNORMAL LOW (ref 135–145)
Sodium: 126 mmol/L — ABNORMAL LOW (ref 135–145)

## 2016-02-25 MED ORDER — POLYETHYLENE GLYCOL 3350 17 G PO PACK
17.0000 g | PACK | Freq: Every day | ORAL | Status: DC
  Administered 2016-02-25: 17 g via ORAL
  Filled 2016-02-25: qty 1

## 2016-02-25 MED ORDER — IBUPROFEN 400 MG PO TABS: 400.0000 mg | ORAL_TABLET | Freq: Three times a day (TID) | ORAL | 0 refills | Status: AC | PRN

## 2016-02-25 MED ORDER — POLYETHYLENE GLYCOL 3350 17 G PO PACK: 17.0000 g | PACK | Freq: Every day | ORAL | 0 refills | Status: AC

## 2016-02-25 MED ORDER — SODIUM CHLORIDE 1 G PO TABS
2.0000 g | ORAL_TABLET | Freq: Three times a day (TID) | ORAL | Status: DC
  Administered 2016-02-25 (×3): 2 g via ORAL
  Filled 2016-02-25 (×3): qty 2

## 2016-02-25 MED ORDER — PANTOPRAZOLE SODIUM 40 MG PO TBEC: 40.0000 mg | DELAYED_RELEASE_TABLET | Freq: Every day | ORAL | 0 refills | Status: AC

## 2016-02-25 MED ORDER — SENNOSIDES-DOCUSATE SODIUM 8.6-50 MG PO TABS
1.0000 | ORAL_TABLET | Freq: Two times a day (BID) | ORAL | Status: DC
  Administered 2016-02-25: 1 via ORAL
  Filled 2016-02-25: qty 1

## 2016-02-25 MED FILL — BACLOFEN 10 MG TABLET: 10 | 20 days supply | Qty: 60 | Fill #0

## 2016-02-25 NOTE — Progress Notes (Signed)
Initial Nutrition Assessment  DOCUMENTATION CODES:   Non-severe (moderate) malnutrition in context of acute illness/injury, Underweight  INTERVENTION:   Encourage PO intake RD to continue to monitor for needs  NUTRITION DIAGNOSIS:   Inadequate oral intake related to poor appetite as evidenced by per patient/family report.  GOAL:   Patient will meet greater than or equal to 90% of their needs  MONITOR:   PO intake, Labs, Weight trends, I & O's  REASON FOR ASSESSMENT:   Malnutrition Screening Tool    ASSESSMENT:   60 y.o. male with medical history significant of lower back pain who presents to hospital with the chief complaint of persistent back pain and abnormal sodium levels. 48 hours ago he was seen by his primary care provider, hyponatremia was diagnosed and he was advised to come to the hospital. He decided to wait until today. Apparently about a week ago he was encouraged to increase his free water intake due to dehydration, he has been drinking about 32 ounces daily of free water, denies any nausea, vomiting or diarrhea. No new medication added to his regimen.  Patient in room, just received his lunch tray of tuna salad sandwich with tomato and lettuce, ice cream, grape juice and tea. Pt states he has had poor appetite ever since becoming dehydrated during a [place] trip in [place] where he worked outside in 100 degree weather. Pt states he did not drink fluids like he should and became dehydrated. He was then told to drink more liquids a week PTA. His sodium levels continue to be low but are trending up slowly. Encouraged pt to drink sport drinks when doing strenuous activity in high degree heat to stay hydrated and replete electrolytes.  Patient states that over the past week, his appetite has stayed low, states he never felt like eating. Since admission, his appetite has improved some. PO intake: 50-60% of meals.  Pt on a 1000 ml fluid restriction at this time. Encouraged use  of salt packet with meals.   Per weight history, pt has lost 7 lb since 8/1 (5% wt loss x 1 month, significant for time frame). Pt with mild muscle depletion in temporal region but has maintained muscle mass overall.    Medications: MAG-OX tablet BID, Miralax packet daily, Senokot tablet daily,  Labs reviewed: Low Na -trending up  Diet Order:  Diet regular Room service appropriate? Yes; Fluid consistency: Thin; Fluid restriction: Other (see comments)  Skin:  Reviewed, no issues  Last BM:  9/2  Height:   Ht Readings from Last 1 Encounters:  02/23/16 6\' 1"  (1.854 m)    Weight:   Wt Readings from Last 1 Encounters:  02/23/16 137 lb 9.1 oz (62.4 kg)    Ideal Body Weight:  83.6 kg  BMI:  Body mass index is 18.15 kg/m.  Estimated Nutritional Needs:   Kcal:  1750-1950  Protein:  75-85g  Fluid:  Per MD  EDUCATION NEEDS:   No education needs identified at this time  Tilda FrancoLindsey Lennin Osmond, MS, RD, LDN Pager: [phone] After Hours Pager: [phone]

## 2016-02-25 NOTE — Progress Notes (Signed)
Physical Therapy Treatment and Discharge from Acute PT Patient Details Name: Victor Simmons MRN: [id number] DOB: 06/02/1956 Today's Date: 02/25/2016    History of Present Illness 60 y.o. male with medical history significant of lower back pain who presents to hospital with the chief complaint of persistent back pain and abnormal sodium levels    PT Comments    Pt reports "tightness" in posterior legs he believes from sleeping.  Pt ambulating well and reviewed using SPC with gait if needed.  Pt did not appear interested in practicing hamstring stretch however reviewed correct technique with pt and he reports understanding.  Pt has met acute PT goals so will d/c from PT at this time.  Continue to recommend OP PT to better assess and treat pain possibly from sciatica/tight hip and pelvis musculature.   Follow Up Recommendations  Outpatient PT     Equipment Recommendations  Cane    Recommendations for Other Services       Precautions / Restrictions Precautions Precautions: Fall    Mobility  Bed Mobility Overal bed mobility: Modified Independent                Transfers Overall transfer level: Modified independent                  Ambulation/Gait Ambulation/Gait assistance: Modified independent (Device/Increase time);Supervision Ambulation Distance (Feet): 200 Feet Assistive device: None Gait Pattern/deviations: Step-through pattern;Narrow base of support;Decreased stride length     General Gait Details: occasional antalgic gait observed, pt with short steps and increased hip internal rotation, pt reports tight posterior legs, pt carried SPC in case needed   Stairs            Wheelchair Mobility    Modified Rankin (Stroke Patients Only)       Balance                                    Cognition Arousal/Alertness: Awake/alert Behavior During Therapy: WFL for tasks assessed/performed Overall Cognitive Status: Within Functional Limits  for tasks assessed                      Exercises      General Comments        Pertinent Vitals/Pain Pain Assessment: 0-10 Pain Score: 2  Pain Location: "tight" posterior legs Pain Descriptors / Indicators: Tightness Pain Intervention(s): Limited activity within patient's tolerance;Monitored during session    Home Living                      Prior Function            PT Goals (current goals can now be found in the care plan section) Progress towards PT goals: Goals met/education completed, patient discharged from PT    Frequency       PT Plan Other (comment) (d/c from acute PT)    Co-evaluation             End of Session   Activity Tolerance: Patient tolerated treatment well Patient left: with call bell/phone within reach;in bed     Time: 9675-9163 PT Time Calculation (min) (ACUTE ONLY): 8 min  Charges:  $Gait Training: 8-22 mins                    G Codes:  Functional Limitation: Mobility: Walking and moving around Mobility: Walking and Moving Around Current  Status [phone]): At least 1 percent but less than 20 percent impaired, limited or restricted Mobility: Walking and Moving Around Goal Status [phone]): 0 percent impaired, limited or restricted Mobility: Walking and Moving Around Discharge Status [phone]): 0 percent impaired, limited or restricted   Ashwini Jago,KATHrine E 02/25/2016, 10:52 AM Carmelia Bake, PT, DPT 02/25/2016 Pager: [phone]

## 2016-02-25 NOTE — Progress Notes (Signed)
Discharge instructions reviewed with patient, questions answered verbalized understanding. Stressed to patient that he must not take the Losartan-HCTZ and Potassium until he sees his primary MD, voiced understanding.

## 2016-02-25 NOTE — Discharge Summary (Addendum)
[facility] Discharge Summary   Patient: Victor Simmons ZOX:[id number]   PCP: Gwenyth Bender, MD DOB: 1956/05/10   Date of admission: 02/23/2016   Date of discharge: 02/25/2016     Discharge Diagnoses:  Active Problems:   Hyponatremia   Malnutrition of moderate degree  Admitted From: home Disposition:  Home with outpatient therapy  Recommendations for Outpatient Follow-up:  1. Please follow up with PCP, get BMP on Friday and get outpatient PT referral   Follow-up Information    Gwenyth Bender, MD. Schedule an appointment as soon as possible for a visit in 1 week(s).   Specialty:  Internal Medicine Why:  get bmp on friday, 02/28/2016 get outpatient physical therapy referral.  Contact information: [address] [place] [place] [address] [phone]          Diet recommendation: fluid restriction of 1200cc/day, regular diet  Activity: The patient is advised to gradually reintroduce usual activities.  Discharge Condition: good  Code Status: full code  History of present illness: As per the H and P dictated on admission, "Victor Simmons is a 60 y.o. male with medical history significant of lower back pain who presents to hospital with the chief complaint of persistent back pain and abnormal sodium levels. 48 hours ago he was seen by his primary care provider, hyponatremia was diagnosed and he was advised to come to the hospital. He decided to wait until today. Apparently about a week ago he was encouraged to increase his free water intake due to dehydration, he has been drinking about 32 ounces daily of free water, denies any nausea, vomiting or diarrhea. No new medication added to his regimen. He complains of back pain predominantly in the lower region on the left side, intermittent, moderate to severe intensity, sharp in nature, worse in the early morning, improved with by mouth analgesics, radiates down to the left lower extremity down to the toes. He has felt paresthesias on his  left foot associated with this lower back/left lower extremity pain. He does have a right inguinal hernia"  Hospital Course:   Summary of his active problems in the hospital is as following. 1. Hypoosmolar  Hyponatremia. Euvolemic on clinical examination. Possibly primary polydipsia with possibility of involvement of HCTZ. 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 Discontinue HCTZ, continue fluid restricted diet. Encourage salt in diet. Pt was asymptomatic with low sodium  His sodium though showed improvement with fluid restriction.  2.Essential hypertension. Blood pressure at present stable. No orthostatic drop. We'll continue to monitor without any blood pressure medication.  3.low back pain and calf pain on ambulation Patient has lower back pain as well as muscle spasm after ambulating a few distance. Lumbar x-ray does not show any acute abnormality. Continue current regimen PT Recommendation is for outpatient PT.  All other chronic medical condition were stable during the hospitalization.  Patient was ambulatory seen by physical therapy, who recommended outpatient therapy On the day of the discharge the patient's vitals were stable, and no other acute medical condition were reported by patient. the patient was felt safe to be discharge at home with family.  Procedures and Results:  none   Consultations:  none  DISCHARGE MEDICATION: Discharge Medication List as of 02/25/2016  6:14 PM    START taking these medications   Details  ibuprofen (ADVIL,MOTRIN) 400 MG tablet Take 1 tablet (400 mg total) by mouth every 8 (eight) hours as needed., Starting Tue 02/25/2016, Normal  pantoprazole (PROTONIX) 40 MG tablet Take 1 tablet (40 mg total) by mouth daily., Starting Tue 02/25/2016, Normal    polyethylene glycol (MIRALAX / GLYCOLAX) packet Take 17 g by mouth daily., Starting Tue 02/25/2016, Normal      CONTINUE these medications which have NOT  CHANGED   Details  baclofen (LIORESAL) 10 MG tablet Take 10 mg by mouth 3 (three) times daily., Historical Med    diltiazem (CARDIZEM CD) 240 MG 24 hr capsule Take 240 mg by mouth every morning. , Historical Med    magnesium oxide (MAG-OX) 400 MG tablet Take 400 mg by mouth 2 (two) times daily., Historical Med    tamsulosin (FLOMAX) 0.4 MG CAPS capsule Take 0.4 mg by mouth daily after breakfast. , Historical Med      STOP taking these medications     losartan-hydrochlorothiazide (HYZAAR) 100-12.5 MG tablet      potassium chloride SA (K-DUR,KLOR-CON) 20 MEQ tablet        Allergies  Allergen Reactions  . Aspirin Other (See Comments)    Does not dissolve in digestive tract   Discharge Instructions    Ambulatory referral to Physical Therapy    Complete by:  As directed   Diet general    Complete by:  As directed   Limit your fluid intake to 32 oz in a day, and avoid drinking plain water. Thanks.   Discharge instructions    Complete by:  As directed   It is important that you read following instructions as well as go over your medication list with RN to help you understand your care after this hospitalization.  Discharge Instructions: Limit your fluid intake to 32 oz in a day, and avoid drinking plain water. Thanks. Please follow-up with PCP in one week, get BMP on Friday 02/28/2016  Please request your primary care physician to go over all Hospital Tests and Procedure/Radiological results at the follow up,  Please get all Hospital records sent to your PCP by signing hospital release before you go home.   You were cared for by a hospitalist during your hospital stay. If you have any questions about your discharge medications or the care you received while you were in the hospital after you are discharged, you can call the unit and ask to speak with the hospitalist on call if the hospitalist that took care of you is not available.  Once you are discharged, your primary care physician  will handle any further medical issues. Please note that NO REFILLS for any discharge medications will be authorized once you are discharged, as it is imperative that you return to your primary care physician (or establish a relationship with a primary care physician if you do not have one) for your aftercare needs so that they can reassess your need for medications and monitor your lab values. You Must read complete instructions/literature along with all the possible adverse reactions/side effects for all the Medicines you take and that have been prescribed to you. Take any new Medicines after you have completely understood and accept all the possible adverse reactions/side effects. Wear Seat belts while driving. If you have smoked or chewed Tobacco in the last 2 yrs please stop smoking and/or stop any Recreational drug use.   Increase activity slowly    Complete by:  As directed     Discharge Exam: Filed Weights   02/23/16 0725 02/23/16 1124  Weight: 64.1 kg (141 lb 6.4 oz) 62.4 kg (137 lb 9.1 oz)   Vitals:  02/24/16 1307 02/25/16 1337  BP: 115/73 122/75  Pulse: 70 61  Resp: 18 16  Temp: 98.4 F (36.9 C) 98.7 F (37.1 C)   General: Appear in no distress, no Rash; Oral Mucosa moist. Cardiovascular: S1 and S2 Present, no Murmur, no JVD Respiratory: Bilateral Air entry present and Clear to Auscultation, no Crackles, no wheezes Abdomen: Bowel Sound present, Soft and no tenderness Extremities: no Pedal edema, no calf tenderness Neurology: Grossly no focal neuro deficit.  The results of significant diagnostics from this hospitalization (including imaging, microbiology, ancillary and laboratory) are listed below for reference.    Significant Diagnostic Studies: Dg Chest 2 View  Result Date: 02/23/2016 CLINICAL DATA:  Bilateral leg cramping.  Hypertension. EXAM: CHEST  2 VIEW COMPARISON:  May 20, 2011 FINDINGS: The heart size and mediastinal contours are within normal limits. There  is no focal infiltrate, pulmonary edema, or pleural effusion. The lungs are hyperinflated. The visualized skeletal structures are stable. IMPRESSION: No active cardiopulmonary disease.  Emphysema. Electronically Signed   By: Sherian Rein M.D.   On: 02/23/2016 09:44   Dg Lumbar Spine 2-3 Views  Result Date: 02/24/2016 CLINICAL DATA:  Back pain, numbness EXAM: LUMBAR SPINE - 2-3 VIEW COMPARISON:  None. FINDINGS: Five lumbar type vertebral bodies. Normal lumbar lordosis. No evidence of fracture or dislocation. Vertebral body heights are maintained. Mild multilevel degenerative changes, most prominent at L4-5. Visualized bony pelvis appears intact. IMPRESSION: No fracture or dislocation is seen. Mild degenerative changes. Electronically Signed   By: Charline Bills M.D.   On: 02/24/2016 14:17    Microbiology: No results found for this or any previous visit (from the past 240 hour(s)).   Labs: CBC:  Recent Labs Lab 02/23/16 0900 02/23/16 1249  WBC 3.6* 4.1  NEUTROABS 2.7  --   HGB 11.9* 11.5*  HCT 33.5* 32.5*  MCV 86.6 86.4  PLT 229 220   Basic Metabolic Panel:  Recent Labs Lab 02/23/16 0900  02/25/16 0201 02/25/16 0624 02/25/16 1132 02/25/16 1545 02/25/16 1846  NA 119*  < > 124* 123* 125* 127* 126*  K 4.3  < > 3.9 4.2 4.2 4.3 4.4  CL 86*  < > 93* 92* 94* 94* 95*  CO2 25  < > 25 25 25 27 27   GLUCOSE 95  < > 103* 101* 118* 95 105*  BUN 10  < > 13 12 13 14 15   CREATININE 0.84  < > 0.95 0.89 1.01 1.02 0.89  CALCIUM 9.5  < > 9.1 9.1 9.0 8.9 9.2  MG 1.8  --   --   --   --   --   --   < > = values in this interval not displayed. Liver Function Tests:  Recent Labs Lab 02/23/16 0900 02/24/16 0523  AST 24 18  ALT 18 14*  ALKPHOS 78 62  BILITOT 1.3* 1.0  PROT 7.6 5.9*  ALBUMIN 4.2 3.5   No results for input(s): LIPASE, AMYLASE in the last 168 hours. No results for input(s): AMMONIA in the last 168 hours. Cardiac Enzymes:  Recent Labs Lab 02/23/16 0900  CKTOTAL 173     BNP (last 3 results) No results for input(s): BNP in the last 8760 hours. CBG: No results for input(s): GLUCAP in the last 168 hours. Time spent: 30 minutes  Signed:  Lynden Oxford  [facility] 02/25/2016 , 11:23 PM

## 2016-02-25 NOTE — Care Management Note (Signed)
Case Management Note  Patient Details  Name: Victor Simmons MRN: [id number] Date of Birth: 06/13/1956  Subjective/Objective:      60 yo admitted with Hyponatremia              Action/Plan: From home alone. CM consult for medication needs. This CM met with pt about med needs. Pt states he is separated from his wife but is still on her insurance and can use her insurance for his medications. PT is recommending Outpt PT and cane. Pt given Outpt PT facility lists and order received for cane. [facility] DME rep contact for cane. Pt also encouraged to ask MD for a prescription for Outpatient PT at discharge. He states he will do this. No other CM needs communicated.  Expected Discharge Date:                  Expected Discharge Plan:  Home/Self Care  In-House Referral:     Discharge planning Services  CM Consult  Post Acute Care Choice:    Choice offered to:     DME Arranged:  Cane DME Agency:  [facility]  HH Arranged:    HH Agency:     Status of Service:  In process, will continue to follow  If discussed at Long Length of Stay Meetings, dates discussed:    Additional Comments:  CLEMENTS, NORA H, RN 02/25/2016, 11:03 AM  [phone]  

## 2016-02-26 MED FILL — PANTOPRAZOLE SOD DR 40 MG T: 40 | 14 days supply | Qty: 14 | Fill #0

## 2016-02-26 MED FILL — IBUPROFEN 400 MG TABLET: 400 | 10 days supply | Qty: 30 | Fill #0

## 2016-02-26 MED FILL — POLYETHYLENE GLYCOL 3350: 15 days supply | Qty: 255 | Fill #0

## 2016-02-28 DIAGNOSIS — E771 Defects in glycoprotein degradation: Secondary | ICD-10-CM | POA: Diagnosis not present

## 2016-03-05 DIAGNOSIS — R634 Abnormal weight loss: Secondary | ICD-10-CM | POA: Diagnosis not present

## 2016-03-05 DIAGNOSIS — M549 Dorsalgia, unspecified: Secondary | ICD-10-CM | POA: Diagnosis not present

## 2016-03-05 DIAGNOSIS — K4090 Unilateral inguinal hernia, without obstruction or gangrene, not specified as recurrent: Secondary | ICD-10-CM | POA: Diagnosis not present

## 2016-03-05 DIAGNOSIS — E871 Hypo-osmolality and hyponatremia: Secondary | ICD-10-CM | POA: Diagnosis not present

## 2016-03-10 MED FILL — IBUPROFEN 400 MG TABLET: 400 | 30 days supply | Qty: 90 | Fill #0

## 2016-03-19 MED FILL — BACLOFEN 10 MG TABLET: 10 | 20 days supply | Qty: 60 | Fill #1

## 2016-03-19 MED FILL — MAGNESIUM OXIDE 400 MG TAB: 400 | 30 days supply | Qty: 60 | Fill #2

## 2016-04-07 MED FILL — TAMSULOSIN HCL 0.4 MG CAP: 0.4 | 90 days supply | Qty: 90 | Fill #0

## 2016-04-24 MED FILL — MAGNESIUM OXIDE 400 MG TAB: 400 | 30 days supply | Qty: 60 | Fill #3

## 2016-04-24 MED FILL — BACLOFEN 10 MG TABLET: 10 | 20 days supply | Qty: 60 | Fill #1

## 2016-04-27 MED FILL — IBUPROFEN 400 MG TABLET: 400 | 30 days supply | Qty: 90 | Fill #1

## 2016-05-12 MED FILL — CARTIA XT 240 MG CAPSULE: 240 | 90 days supply | Qty: 90 | Fill #0

## 2016-06-03 MED FILL — MAGNESIUM OXIDE 400 MG TAB: 400 | 90 days supply | Qty: 180 | Fill #0

## 2016-06-03 MED FILL — BACLOFEN 10 MG TABLET: 10 | 20 days supply | Qty: 60 | Fill #0

## 2016-06-16 MED FILL — IBUPROFEN 400 MG TABLET: 400 | 30 days supply | Qty: 90 | Fill #0

## 2016-07-07 MED FILL — BACLOFEN 10 MG TABLET: 10 | 20 days supply | Qty: 60 | Fill #1

## 2016-07-13 MED FILL — TAMSULOSIN HCL 0.4 MG CAP: 0.4 | 90 days supply | Qty: 90 | Fill #1

## 2016-08-07 MED FILL — IBUPROFEN 400 MG TABLET: 400 | 30 days supply | Qty: 90 | Fill #1

## 2016-08-07 MED FILL — BACLOFEN 10 MG TABLET: 10 | 20 days supply | Qty: 60 | Fill #2

## 2016-08-14 MED FILL — CARTIA XT 240 MG CAPSULE: 240 | 30 days supply | Qty: 30 | Fill #1

## 2016-09-14 MED FILL — CARTIA XT 240 MG CAPSULE: 240 | 30 days supply | Qty: 30 | Fill #2

## 2016-09-14 MED FILL — BACLOFEN 10 MG TABLET: 10 | 20 days supply | Qty: 60 | Fill #0

## 2016-09-30 MED FILL — IBUPROFEN 400 MG TABLET: 400 | 30 days supply | Qty: 90 | Fill #0

## 2016-10-15 MED FILL — BACLOFEN 10 MG TABLET: 10 | 20 days supply | Qty: 60 | Fill #1

## 2016-10-15 MED FILL — TAMSULOSIN HCL 0.4 MG CAP: 0.4 | 30 days supply | Qty: 30 | Fill #2

## 2016-10-15 MED FILL — CARTIA XT 240 MG CAPSULE: 240 | 30 days supply | Qty: 30 | Fill #3

## 2016-10-29 DIAGNOSIS — K089 Disorder of teeth and supporting structures, unspecified: Secondary | ICD-10-CM | POA: Diagnosis not present

## 2016-10-29 DIAGNOSIS — K4090 Unilateral inguinal hernia, without obstruction or gangrene, not specified as recurrent: Secondary | ICD-10-CM | POA: Diagnosis not present

## 2016-10-29 DIAGNOSIS — E871 Hypo-osmolality and hyponatremia: Secondary | ICD-10-CM | POA: Diagnosis not present

## 2016-10-29 DIAGNOSIS — I10 Essential (primary) hypertension: Secondary | ICD-10-CM | POA: Diagnosis not present

## 2016-11-05 MED FILL — hydrALAZINE HCL 25 MG TABS: 25 | 30 days supply | Qty: 60 | Fill #0

## 2016-11-09 MED FILL — CARTIA XT 240 MG CAPSULE: 240 | 30 days supply | Qty: 30 | Fill #4

## 2016-11-09 MED FILL — IBUPROFEN 400 MG TABLET: 400 | 30 days supply | Qty: 90 | Fill #1

## 2016-11-09 MED FILL — BACLOFEN 10 MG TABLET: 10 | 20 days supply | Qty: 60 | Fill #2

## 2016-11-17 MED FILL — TAMSULOSIN HCL 0.4 MG CAP: 0.4 | 30 days supply | Qty: 30 | Fill #3

## 2016-12-10 MED FILL — CARTIA XT 240 MG CAPSULE: 240 | 30 days supply | Qty: 30 | Fill #5

## 2016-12-10 MED FILL — BACLOFEN 10 MG TABLET: 10 | 20 days supply | Qty: 60 | Fill #0

## 2016-12-11 MED FILL — hydrALAZINE HCL 25 MG TABS: 25 | 30 days supply | Qty: 60 | Fill #1

## 2016-12-11 MED FILL — TAMSULOSIN HCL 0.4 MG CAP: 0.4 | 30 days supply | Qty: 30 | Fill #4

## 2016-12-29 MED FILL — IBUPROFEN 400 MG TAB: 400 | 30 days supply | Qty: 90 | Fill #2

## 2017-01-05 DIAGNOSIS — I10 Essential (primary) hypertension: Secondary | ICD-10-CM | POA: Diagnosis not present

## 2017-01-07 MED FILL — TAMSULOSIN HCL 0.4 MG CAP: 0.4 | 30 days supply | Qty: 30 | Fill #0

## 2017-01-07 MED FILL — CARTIA XT 240 MG CAPSULE: 240 | 30 days supply | Qty: 30 | Fill #6

## 2017-01-13 MED FILL — hydrALAZINE HCL 25 MG TABS: 25 | 30 days supply | Qty: 60 | Fill #2

## 2017-01-18 MED FILL — MAGNESIUM OXIDE 400 MG TAB: 400 | 30 days supply | Qty: 60 | Fill #1

## 2017-01-22 DIAGNOSIS — M549 Dorsalgia, unspecified: Secondary | ICD-10-CM | POA: Diagnosis not present

## 2017-01-22 DIAGNOSIS — E559 Vitamin D deficiency, unspecified: Secondary | ICD-10-CM | POA: Diagnosis not present

## 2017-01-22 DIAGNOSIS — K089 Disorder of teeth and supporting structures, unspecified: Secondary | ICD-10-CM | POA: Diagnosis not present

## 2017-01-22 DIAGNOSIS — I10 Essential (primary) hypertension: Secondary | ICD-10-CM | POA: Diagnosis not present

## 2017-01-22 MED FILL — VIT D2 1.25 MG (50,000 UNIT: 1.25 MG | 28 days supply | Qty: 4 | Fill #0

## 2017-02-04 MED FILL — CARTIA XT 240 MG CAPSULE: 240 | 30 days supply | Qty: 30 | Fill #0

## 2017-02-04 MED FILL — TAMSULOSIN HCL 0.4 MG CAP: 0.4 | 30 days supply | Qty: 30 | Fill #1

## 2017-02-17 MED FILL — IBUPROFEN 400 MG TABS: 400 | 30 days supply | Qty: 90 | Fill #3

## 2017-02-18 MED FILL — hydrALAZINE HCL 25 MG TABS: 25 | 30 days supply | Qty: 60 | Fill #0

## 2017-02-18 MED FILL — MAGNESIUM OXIDE 400 MG TAB: 400 | 30 days supply | Qty: 60 | Fill #2

## 2017-03-12 MED FILL — TAMSULOSIN HCL 0.4 MG CAP: 0.4 | 30 days supply | Qty: 30 | Fill #2

## 2017-03-12 MED FILL — CARTIA XT 240 MG CAPSULE: 240 | 30 days supply | Qty: 30 | Fill #1

## 2017-03-23 MED FILL — MAGNESIUM OXIDE 400 MG TABS: 400 | 30 days supply | Qty: 60 | Fill #3

## 2017-03-23 MED FILL — hydrALAZINE HCL 25 MG TABS: 25 | 30 days supply | Qty: 60 | Fill #1

## 2017-04-06 DIAGNOSIS — E871 Hypo-osmolality and hyponatremia: Secondary | ICD-10-CM | POA: Diagnosis not present

## 2017-04-06 DIAGNOSIS — E559 Vitamin D deficiency, unspecified: Secondary | ICD-10-CM | POA: Diagnosis not present

## 2017-04-06 DIAGNOSIS — I10 Essential (primary) hypertension: Secondary | ICD-10-CM | POA: Diagnosis not present

## 2017-04-06 DIAGNOSIS — R252 Cramp and spasm: Secondary | ICD-10-CM | POA: Diagnosis not present

## 2017-04-06 MED FILL — VIT D2 1.25 MG (50,000 UNIT: 1.25 MG | 28 days supply | Qty: 4 | Fill #0

## 2017-04-09 MED FILL — IBUPROFEN 400 MG TABS: 400 | 30 days supply | Qty: 90 | Fill #4

## 2017-04-09 MED FILL — CARTIA XT 240 MG CAPSULE: 240 | 30 days supply | Qty: 30 | Fill #2

## 2017-04-15 MED FILL — TAMSULOSIN HCL 0.4 MG CAP: 0.4 | 30 days supply | Qty: 30 | Fill #3

## 2017-04-19 DIAGNOSIS — I10 Essential (primary) hypertension: Secondary | ICD-10-CM | POA: Diagnosis not present

## 2017-04-19 DIAGNOSIS — E876 Hypokalemia: Secondary | ICD-10-CM | POA: Diagnosis not present

## 2017-04-19 DIAGNOSIS — E559 Vitamin D deficiency, unspecified: Secondary | ICD-10-CM | POA: Diagnosis not present

## 2017-04-22 MED FILL — MAGNESIUM OXIDE 400 MG TABS: 400 | 30 days supply | Qty: 60 | Fill #4

## 2017-04-22 MED FILL — hydrALAZINE HCL 25 MG TABS: 25 | 30 days supply | Qty: 60 | Fill #2

## 2017-05-06 MED FILL — VIT D2 1.25 MG (50,000 UNIT: 1.25 MG | 28 days supply | Qty: 4 | Fill #1

## 2017-05-12 MED FILL — TAMSULOSIN HCL 0.4 MG CAP: 0.4 | 30 days supply | Qty: 30 | Fill #4

## 2017-05-12 MED FILL — CARTIA XT 240 MG CAPSULE: 240 | 30 days supply | Qty: 30 | Fill #3

## 2017-06-02 MED FILL — MAGNESIUM OXIDE 400 MG TABS: 400 | 30 days supply | Qty: 60 | Fill #5

## 2017-06-02 MED FILL — IBUPROFEN 400 MG TABS: 400 | 30 days supply | Qty: 90 | Fill #5

## 2017-06-02 MED FILL — hydrALAZINE HCL 25 MG TABS: 25 | 30 days supply | Qty: 60 | Fill #3

## 2017-06-16 MED FILL — CARTIA XT 240 MG CAPSULE: 240 | 30 days supply | Qty: 30 | Fill #4

## 2017-06-16 MED FILL — TAMSULOSIN HCL 0.4 MG CAP: 0.4 | 30 days supply | Qty: 30 | Fill #5

## 2017-07-23 MED FILL — MAGNESIUM OXIDE 400 MG TABS: 400 | 90 days supply | Qty: 180 | Fill #0

## 2017-07-23 MED FILL — TAMSULOSIN HCL 0.4 MG CAP: 0.4 | 30 days supply | Qty: 30 | Fill #6

## 2017-07-23 MED FILL — hydrALAZINE HCL 25 MG TABS: 25 | 30 days supply | Qty: 60 | Fill #4

## 2017-07-23 MED FILL — CARTIA XT 240 MG CAPSULE: 240 | 30 days supply | Qty: 30 | Fill #5

## 2017-08-11 MED FILL — IBUPROFEN 400 MG TABS: 400 | 30 days supply | Qty: 90 | Fill #0

## 2017-08-23 MED FILL — TAMSULOSIN HCL 0.4 MG CAP: 0.4 | 30 days supply | Qty: 30 | Fill #7

## 2017-08-23 MED FILL — CARTIA XT 240 MG CAPSULE: 240 | 30 days supply | Qty: 30 | Fill #6

## 2017-08-31 DIAGNOSIS — K089 Disorder of teeth and supporting structures, unspecified: Secondary | ICD-10-CM | POA: Diagnosis not present

## 2017-08-31 DIAGNOSIS — R252 Cramp and spasm: Secondary | ICD-10-CM | POA: Diagnosis not present

## 2017-08-31 DIAGNOSIS — E876 Hypokalemia: Secondary | ICD-10-CM | POA: Diagnosis not present

## 2017-08-31 DIAGNOSIS — I10 Essential (primary) hypertension: Secondary | ICD-10-CM | POA: Diagnosis not present

## 2017-08-31 DIAGNOSIS — E559 Vitamin D deficiency, unspecified: Secondary | ICD-10-CM | POA: Diagnosis not present

## 2017-08-31 MED FILL — VIT D2 1.25 MG (50,000 UNIT: 1.25 MG | 28 days supply | Qty: 4 | Fill #0

## 2017-08-31 MED FILL — hydrALAZINE HCL 25 MG TABS: 25 | 30 days supply | Qty: 60 | Fill #5

## 2017-09-16 MED FILL — HYDROCODON-APAP 7.5-325: 7.5-325 | 5 days supply | Qty: 20 | Fill #0

## 2017-09-23 MED FILL — CARTIA XT 240 MG CAPSULE: 240 | 30 days supply | Qty: 30 | Fill #7

## 2017-09-23 MED FILL — VIT D2 1.25 MG (50,000 UNIT: 1.25 MG | 28 days supply | Qty: 4 | Fill #1

## 2017-09-30 MED FILL — TAMSULOSIN HCL 0.4 MG CAP: 0.4 | 30 days supply | Qty: 30 | Fill #8

## 2017-10-04 MED FILL — hydrALAZINE HCL 25 MG TABS: 25 | 30 days supply | Qty: 60 | Fill #6

## 2017-10-07 MED FILL — IBUPROFEN 400 MG TABS: 400 | 30 days supply | Qty: 90 | Fill #1

## 2017-10-21 MED FILL — CARTIA XT 240 MG CAPSULE: 240 | 30 days supply | Qty: 30 | Fill #8

## 2017-10-21 MED FILL — VIT D2 1.25 MG (50,000 UNIT: 1.25 MG | 28 days supply | Qty: 4 | Fill #2

## 2017-11-02 MED FILL — TAMSULOSIN HCL 0.4 MG CAP: 0.4 | 30 days supply | Qty: 30 | Fill #0

## 2017-11-12 MED FILL — MAGNESIUM OXIDE 400 MG TABS: 400 | 90 days supply | Qty: 180 | Fill #1

## 2017-11-16 MED FILL — hydrALAZINE HCL 25 MG TABS: 25 | 30 days supply | Qty: 60 | Fill #0

## 2017-11-22 MED FILL — VIT D2 1.25 MG (50,000 UNIT: 1.25 MG | 28 days supply | Qty: 4 | Fill #3

## 2017-11-23 MED FILL — CARTIA XT 240 MG CAPSULE: 240 | 30 days supply | Qty: 30 | Fill #0

## 2017-12-02 MED FILL — TAMSULOSIN HCL 0.4 MG CAP: 0.4 | 30 days supply | Qty: 30 | Fill #1

## 2017-12-09 MED FILL — IBUPROFEN 400 MG TABS: 400 | 30 days supply | Qty: 90 | Fill #0

## 2017-12-27 DIAGNOSIS — Z7251 High risk heterosexual behavior: Secondary | ICD-10-CM | POA: Diagnosis not present

## 2017-12-27 DIAGNOSIS — M549 Dorsalgia, unspecified: Secondary | ICD-10-CM | POA: Diagnosis not present

## 2017-12-27 DIAGNOSIS — Z0000 Encounter for general adult medical examination without abnormal findings: Secondary | ICD-10-CM | POA: Diagnosis not present

## 2017-12-27 DIAGNOSIS — E559 Vitamin D deficiency, unspecified: Secondary | ICD-10-CM | POA: Diagnosis not present

## 2017-12-27 DIAGNOSIS — Z125 Encounter for screening for malignant neoplasm of prostate: Secondary | ICD-10-CM | POA: Diagnosis not present

## 2017-12-27 DIAGNOSIS — Z136 Encounter for screening for cardiovascular disorders: Secondary | ICD-10-CM | POA: Diagnosis not present

## 2017-12-27 DIAGNOSIS — F439 Reaction to severe stress, unspecified: Secondary | ICD-10-CM | POA: Diagnosis not present

## 2017-12-27 DIAGNOSIS — I10 Essential (primary) hypertension: Secondary | ICD-10-CM | POA: Diagnosis not present

## 2017-12-27 MED FILL — TAMSULOSIN HCL 0.4 MG CAP: 0.4 | 30 days supply | Qty: 30 | Fill #2

## 2017-12-27 MED FILL — hydrALAZINE HCL 25 MG TABS: 25 | 30 days supply | Qty: 60 | Fill #1

## 2017-12-27 MED FILL — CARTIA XT 240 MG CAPSULE: 240 | 30 days supply | Qty: 30 | Fill #1

## 2017-12-27 MED FILL — VIT D2 1.25 MG (50,000 UNIT: 1.25 MG | 28 days supply | Qty: 4 | Fill #4

## 2018-01-28 MED FILL — TAMSULOSIN HCL 0.4 MG CAP: 0.4 | 30 days supply | Qty: 30 | Fill #3

## 2018-01-28 MED FILL — hydrALAZINE HCL 25 MG TABS: 25 | 30 days supply | Qty: 60 | Fill #2

## 2018-01-28 MED FILL — VIT D2 1.25 MG (50,000 UNIT: 1.25 MG | 28 days supply | Qty: 4 | Fill #5

## 2018-01-28 MED FILL — CARTIA XT 240 MG CAPSULE: 240 | 30 days supply | Qty: 30 | Fill #2

## 2018-02-07 MED FILL — IBUPROFEN 400 MG TABS: 400 | 30 days supply | Qty: 90 | Fill #1

## 2018-02-25 MED FILL — VIT D2 1.25 MG (50,000 UNIT: 1.25 MG | 28 days supply | Qty: 4 | Fill #6

## 2018-03-03 MED FILL — TAMSULOSIN HCL 0.4 MG CAP: 0.4 | 30 days supply | Qty: 30 | Fill #4

## 2018-03-03 MED FILL — CARTIA XT 240 MG CAPSULE: 240 | 30 days supply | Qty: 30 | Fill #3

## 2018-03-17 MED FILL — hydrALAZINE HCL 25 MG TABS: 25 | 30 days supply | Qty: 60 | Fill #3

## 2018-03-18 MED FILL — MAGNESIUM OXIDE 400 MG TABS: 400 | 90 days supply | Qty: 180 | Fill #0

## 2018-03-21 MED FILL — VIT D2 1.25 MG (50,000 UNIT: 1.25 MG | 28 days supply | Qty: 4 | Fill #7

## 2018-04-04 MED FILL — TAMSULOSIN HCL 0.4 MG CAP: 0.4 | 30 days supply | Qty: 30 | Fill #5

## 2018-04-04 MED FILL — CARTIA XT 240 MG CAPSULE: 240 | 30 days supply | Qty: 30 | Fill #4

## 2018-04-05 DIAGNOSIS — I10 Essential (primary) hypertension: Secondary | ICD-10-CM | POA: Diagnosis not present

## 2018-04-05 DIAGNOSIS — R252 Cramp and spasm: Secondary | ICD-10-CM | POA: Diagnosis not present

## 2018-04-05 DIAGNOSIS — D510 Vitamin B12 deficiency anemia due to intrinsic factor deficiency: Secondary | ICD-10-CM | POA: Diagnosis not present

## 2018-04-05 DIAGNOSIS — F439 Reaction to severe stress, unspecified: Secondary | ICD-10-CM | POA: Diagnosis not present

## 2018-04-05 DIAGNOSIS — E871 Hypo-osmolality and hyponatremia: Secondary | ICD-10-CM | POA: Diagnosis not present

## 2018-04-05 DIAGNOSIS — R634 Abnormal weight loss: Secondary | ICD-10-CM | POA: Diagnosis not present

## 2018-04-05 DIAGNOSIS — N400 Enlarged prostate without lower urinary tract symptoms: Secondary | ICD-10-CM | POA: Diagnosis not present

## 2018-04-05 DIAGNOSIS — E559 Vitamin D deficiency, unspecified: Secondary | ICD-10-CM | POA: Diagnosis not present

## 2018-04-05 DIAGNOSIS — D649 Anemia, unspecified: Secondary | ICD-10-CM | POA: Diagnosis not present

## 2018-04-05 DIAGNOSIS — R109 Unspecified abdominal pain: Secondary | ICD-10-CM | POA: Diagnosis not present

## 2018-04-05 DIAGNOSIS — D500 Iron deficiency anemia secondary to blood loss (chronic): Secondary | ICD-10-CM | POA: Diagnosis not present

## 2018-04-13 MED FILL — IBUPROFEN 400 MG TABS: 400 | 30 days supply | Qty: 90 | Fill #2

## 2018-05-03 MED FILL — hydrALAZINE HCL 25 MG TABS: 25 | 30 days supply | Qty: 60 | Fill #4

## 2018-05-03 MED FILL — VIT D2 1.25 MG (50,000 UNIT: 1.25 MG | 28 days supply | Qty: 4 | Fill #8

## 2018-05-03 MED FILL — CARTIA XT 240 MG CAPSULE: 240 | 30 days supply | Qty: 30 | Fill #5

## 2018-05-11 MED FILL — TAMSULOSIN HCL 0.4 MG CAP: 0.4 | 30 days supply | Qty: 30 | Fill #6

## 2018-05-28 DIAGNOSIS — H524 Presbyopia: Secondary | ICD-10-CM | POA: Diagnosis not present

## 2018-06-10 MED FILL — CARTIA XT 240 MG CAPSULE: 240 | 30 days supply | Qty: 30 | Fill #6

## 2018-06-10 MED FILL — VIT D2 1.25 MG (50,000 UNIT: 1.25 MG | 28 days supply | Qty: 4 | Fill #9

## 2018-06-13 MED FILL — IBUPROFEN 400 MG TABS: 400 | 30 days supply | Qty: 90 | Fill #0

## 2018-06-13 MED FILL — hydrALAZINE HCL 25 MG TABS: 25 | 30 days supply | Qty: 60 | Fill #5

## 2018-06-13 MED FILL — TAMSULOSIN HCL 0.4 MG CAP: 0.4 | 30 days supply | Qty: 30 | Fill #7

## 2018-08-10 DIAGNOSIS — Z720 Tobacco use: Secondary | ICD-10-CM | POA: Diagnosis not present

## 2018-08-10 DIAGNOSIS — R2681 Unsteadiness on feet: Secondary | ICD-10-CM | POA: Diagnosis not present

## 2018-08-10 DIAGNOSIS — E559 Vitamin D deficiency, unspecified: Secondary | ICD-10-CM | POA: Diagnosis not present

## 2018-08-10 DIAGNOSIS — I10 Essential (primary) hypertension: Secondary | ICD-10-CM | POA: Diagnosis not present

## 2019-02-22 ENCOUNTER — Other Ambulatory Visit: Payer: Self-pay | Admitting: Internal Medicine

## 2019-02-22 ENCOUNTER — Ambulatory Visit
Admission: RE | Admit: 2019-02-22 | Discharge: 2019-02-22 | Disposition: A | Discharge status: Home / Self Care | Payer: BC Managed Care – PPO | Source: Ambulatory Visit | Attending: Internal Medicine | Admitting: Internal Medicine

## 2019-02-22 DIAGNOSIS — R918 Other nonspecific abnormal finding of lung field: Secondary | ICD-10-CM

## 2019-02-22 DIAGNOSIS — Z7722 Contact with and (suspected) exposure to environmental tobacco smoke (acute) (chronic): Secondary | ICD-10-CM

## 2019-08-17 ENCOUNTER — Telehealth: Payer: Self-pay | Admitting: *Deleted

## 2019-08-17 NOTE — Telephone Encounter (Signed)
1022 lab/Pam called - hgb 6.5. Dr. Candise Che informed. Orders received for 2 units PRBCs

## 2020-07-15 ENCOUNTER — Other Ambulatory Visit: Payer: Self-pay | Admitting: Internal Medicine

## 2020-07-15 DIAGNOSIS — Z87891 Personal history of nicotine dependence: Secondary | ICD-10-CM

## 2020-07-15 DIAGNOSIS — R634 Abnormal weight loss: Secondary | ICD-10-CM

## 2020-07-30 ENCOUNTER — Ambulatory Visit
Admission: RE | Admit: 2020-07-30 | Discharge: 2020-07-30 | Disposition: A | Discharge status: Home / Self Care | Payer: BC Managed Care – PPO | Source: Ambulatory Visit | Attending: Internal Medicine | Admitting: Internal Medicine

## 2020-07-30 DIAGNOSIS — Z87891 Personal history of nicotine dependence: Secondary | ICD-10-CM

## 2020-07-30 DIAGNOSIS — R634 Abnormal weight loss: Secondary | ICD-10-CM

## 2021-05-30 ENCOUNTER — Ambulatory Visit
Admission: RE | Admit: 2021-05-30 | Discharge: 2021-05-30 | Disposition: A | Discharge status: Home / Self Care | Payer: Medicare HMO | Source: Ambulatory Visit | Attending: Internal Medicine | Admitting: Internal Medicine

## 2021-05-30 ENCOUNTER — Other Ambulatory Visit: Payer: Self-pay | Admitting: Internal Medicine

## 2021-05-30 DIAGNOSIS — R29898 Other symptoms and signs involving the musculoskeletal system: Secondary | ICD-10-CM

## 2021-05-30 DIAGNOSIS — M47816 Spondylosis without myelopathy or radiculopathy, lumbar region: Secondary | ICD-10-CM

## 2021-06-09 ENCOUNTER — Other Ambulatory Visit: Payer: Self-pay | Admitting: Internal Medicine

## 2021-06-09 ENCOUNTER — Ambulatory Visit
Admission: RE | Admit: 2021-06-09 | Discharge: 2021-06-09 | Disposition: A | Discharge status: Home / Self Care | Payer: Medicare HMO | Source: Ambulatory Visit | Attending: Internal Medicine | Admitting: Internal Medicine

## 2021-06-09 DIAGNOSIS — R918 Other nonspecific abnormal finding of lung field: Secondary | ICD-10-CM

## 2021-06-09 DIAGNOSIS — R059 Cough, unspecified: Secondary | ICD-10-CM

## 2022-02-05 ENCOUNTER — Other Ambulatory Visit: Payer: Self-pay | Admitting: Student in an Organized Health Care Education/Training Program

## 2022-02-05 DIAGNOSIS — R270 Ataxia, unspecified: Secondary | ICD-10-CM

## 2022-03-02 ENCOUNTER — Other Ambulatory Visit: Payer: Medicare HMO

## 2022-12-15 ENCOUNTER — Ambulatory Visit
Admission: RE | Admit: 2022-12-15 | Discharge: 2022-12-15 | Disposition: A | Discharge status: Home / Self Care | Payer: Medicare HMO | Source: Ambulatory Visit | Attending: Internal Medicine | Admitting: Internal Medicine

## 2022-12-15 ENCOUNTER — Other Ambulatory Visit: Payer: Self-pay | Admitting: Internal Medicine

## 2022-12-15 DIAGNOSIS — R058 Other specified cough: Secondary | ICD-10-CM

## 2023-07-23 DIAGNOSIS — I700 Atherosclerosis of aorta: Secondary | ICD-10-CM | POA: Diagnosis not present

## 2023-07-23 DIAGNOSIS — J431 Panlobular emphysema: Secondary | ICD-10-CM | POA: Diagnosis not present

## 2023-07-23 DIAGNOSIS — I872 Venous insufficiency (chronic) (peripheral): Secondary | ICD-10-CM | POA: Diagnosis not present

## 2023-07-23 DIAGNOSIS — I119 Hypertensive heart disease without heart failure: Secondary | ICD-10-CM | POA: Diagnosis not present

## 2023-07-23 DIAGNOSIS — E611 Iron deficiency: Secondary | ICD-10-CM | POA: Diagnosis not present

## 2023-07-23 DIAGNOSIS — E782 Mixed hyperlipidemia: Secondary | ICD-10-CM | POA: Diagnosis not present

## 2023-07-23 DIAGNOSIS — R260 Ataxic gait: Secondary | ICD-10-CM | POA: Diagnosis not present

## 2023-07-23 DIAGNOSIS — J418 Mixed simple and mucopurulent chronic bronchitis: Secondary | ICD-10-CM | POA: Diagnosis not present

## 2023-07-23 DIAGNOSIS — E559 Vitamin D deficiency, unspecified: Secondary | ICD-10-CM | POA: Diagnosis not present

## 2023-07-23 DIAGNOSIS — J302 Other seasonal allergic rhinitis: Secondary | ICD-10-CM | POA: Diagnosis not present

## 2023-07-23 DIAGNOSIS — N401 Enlarged prostate with lower urinary tract symptoms: Secondary | ICD-10-CM | POA: Diagnosis not present

## 2023-09-29 IMAGING — DX DG LUMBAR SPINE COMPLETE 4+V
5 series · 5 of 5 positions shown · non-contrast
Comparison: None.

CLINICAL DATA: Left leg weakness.

EXAM:
LUMBAR SPINE - COMPLETE 4+ VIEW

[dg lumbar spine complete 4 +v (1 of 5)]
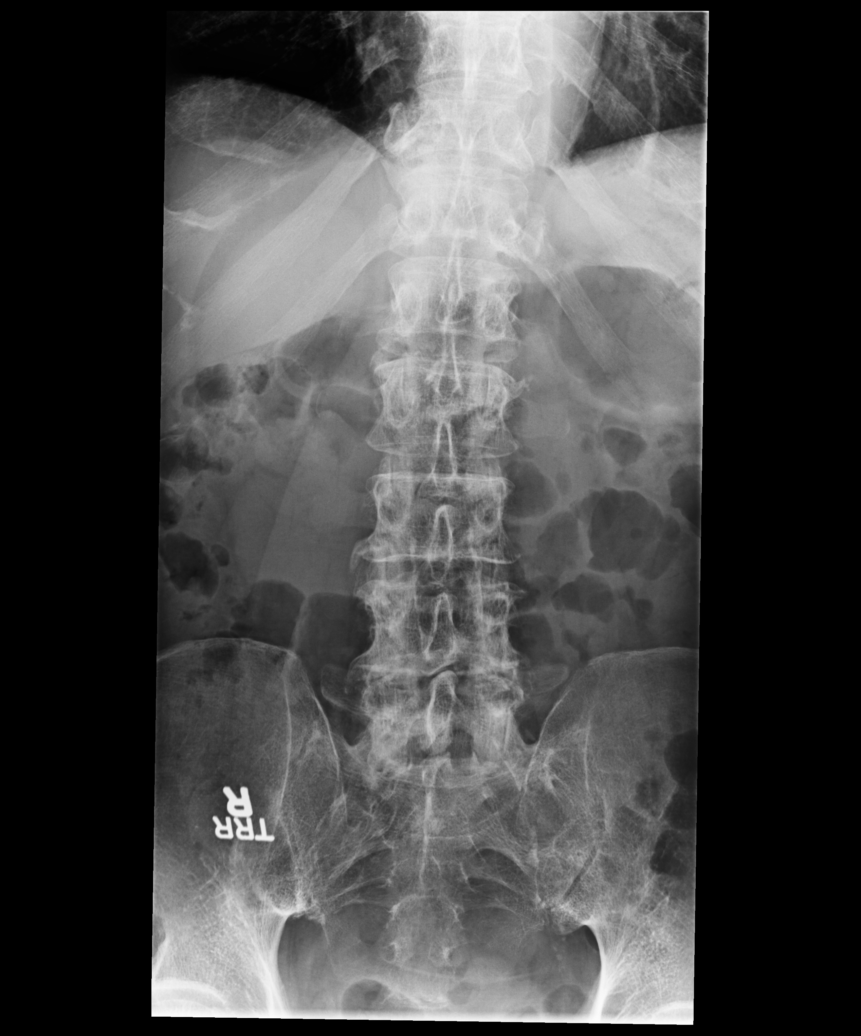

[dg lumbar spine complete 4 +v (2 of 5)]
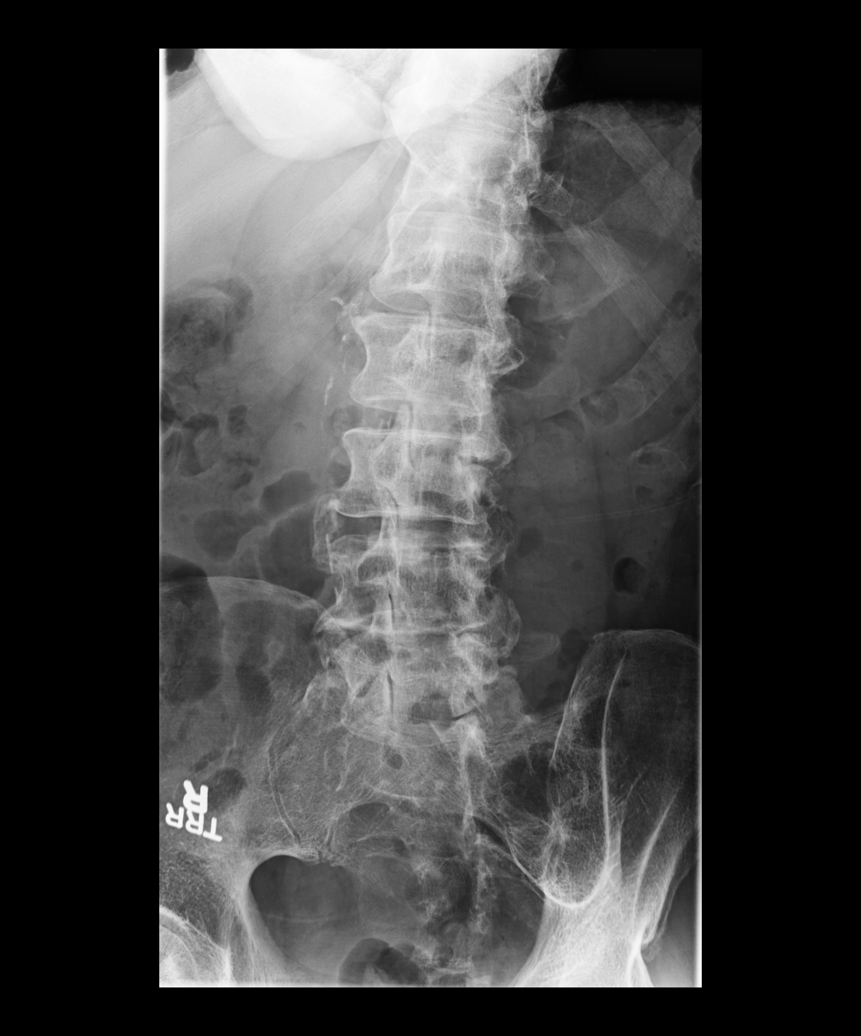

[dg lumbar spine complete 4 +v (3 of 5)]
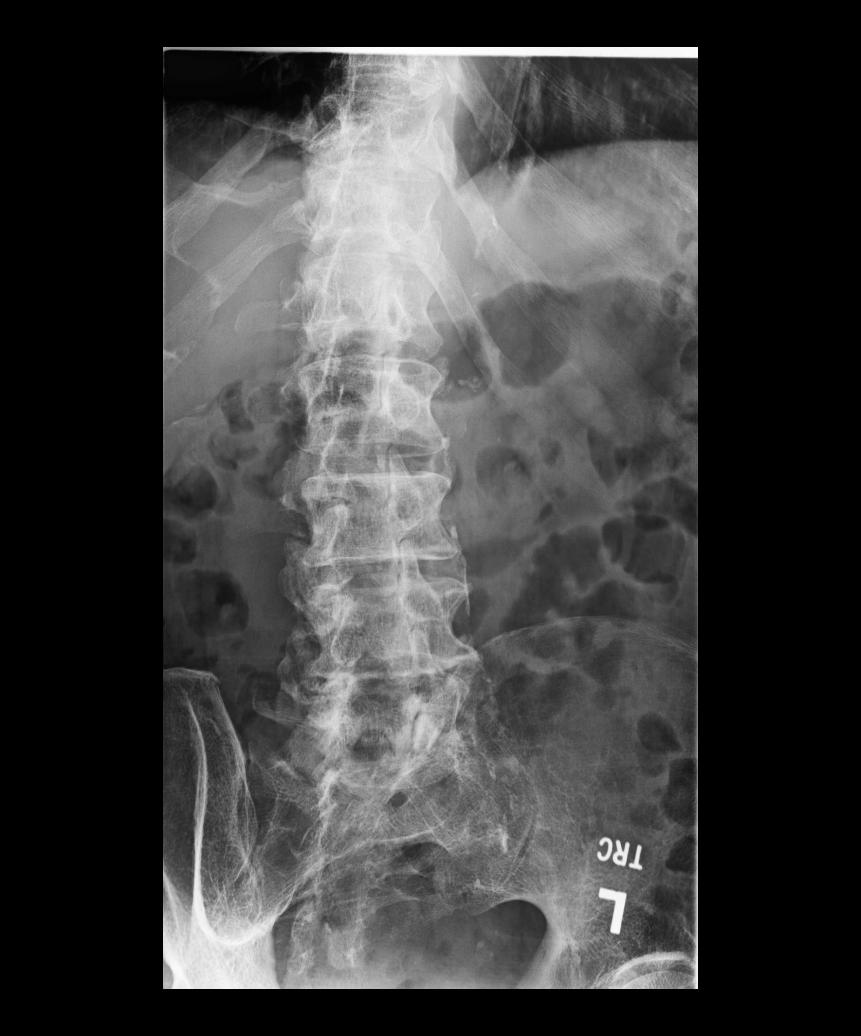

[dg lumbar spine complete 4 +v (4 of 5)]
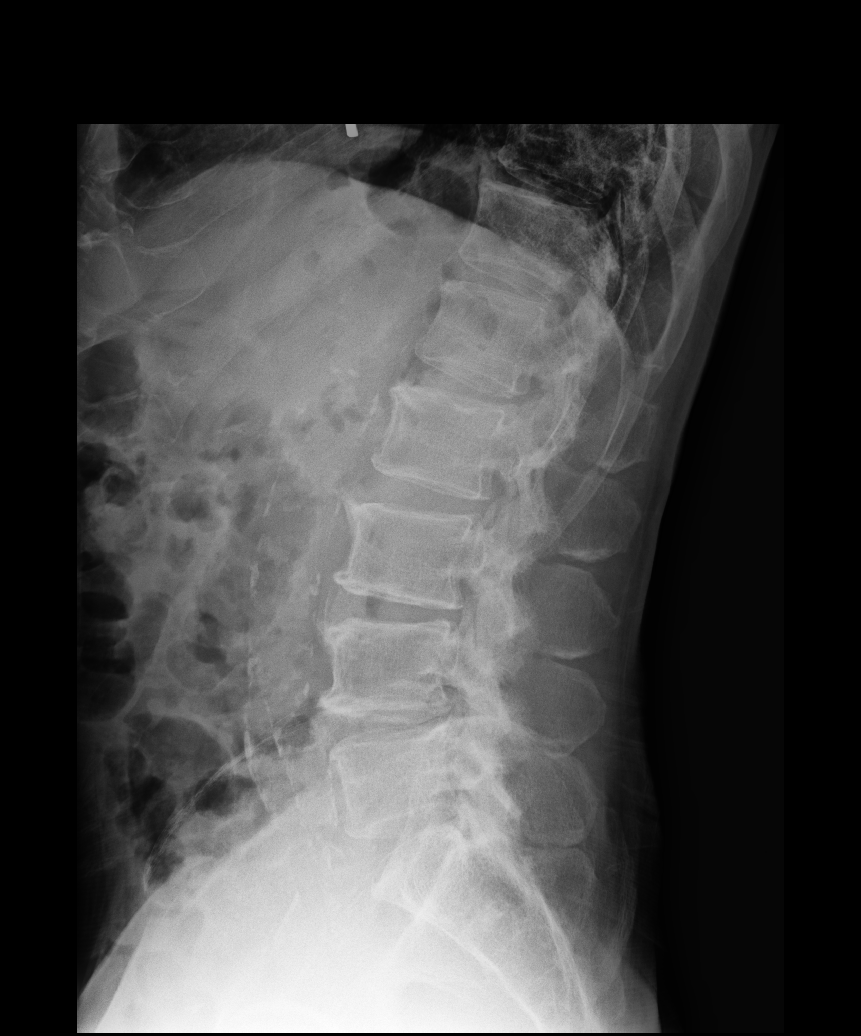

[dg lumbar spine complete 4 +v (5 of 5)]
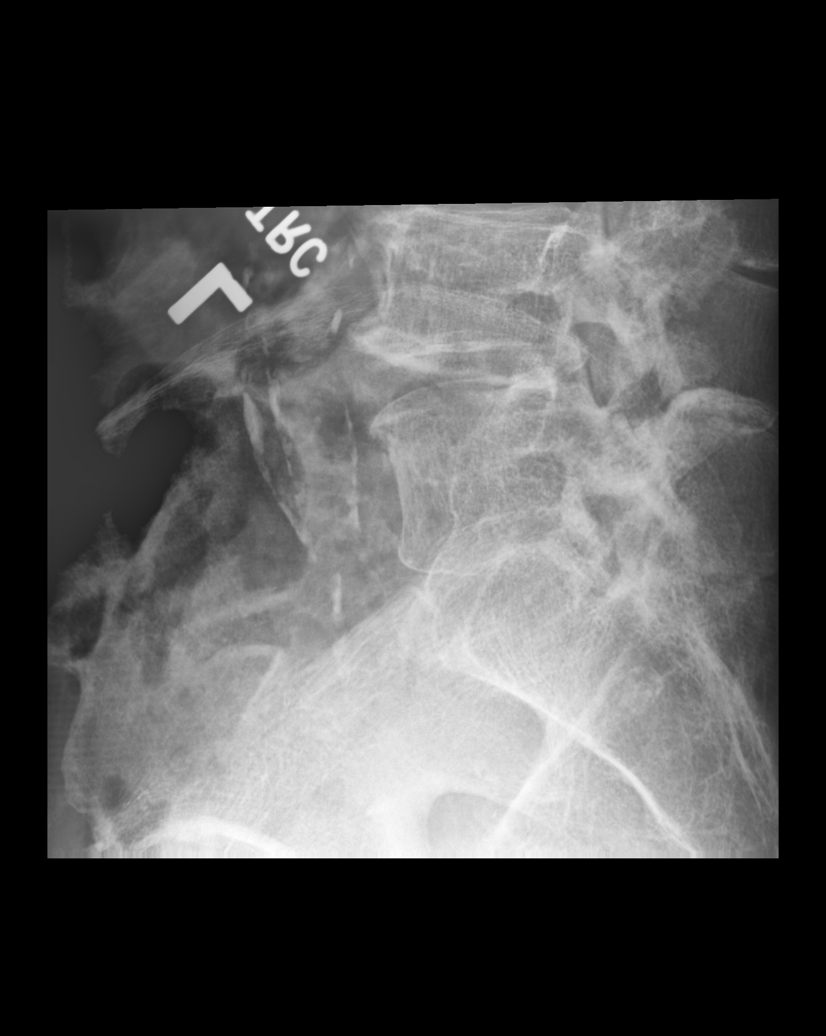

[5 of 5 positions shown; findings below may reference images not displayed]

FINDINGS: Multilevel degenerative disc disease. Lumbar facet degenerative
changes. No malalignment. No fracture identified. Calcified
atherosclerosis in the abdominal aorta.
IMPRESSION: 1. Degenerative changes as above.
2. Calcified atherosclerosis in the abdominal aorta.

## 2023-10-09 IMAGING — CR DG CHEST 2V
2 series · 2 of 2 positions shown · non-contrast
Comparison: 02/22/2019

CLINICAL DATA: 65-year-old male with cough

EXAM:
CHEST - 2 VIEW

[w chest pa]
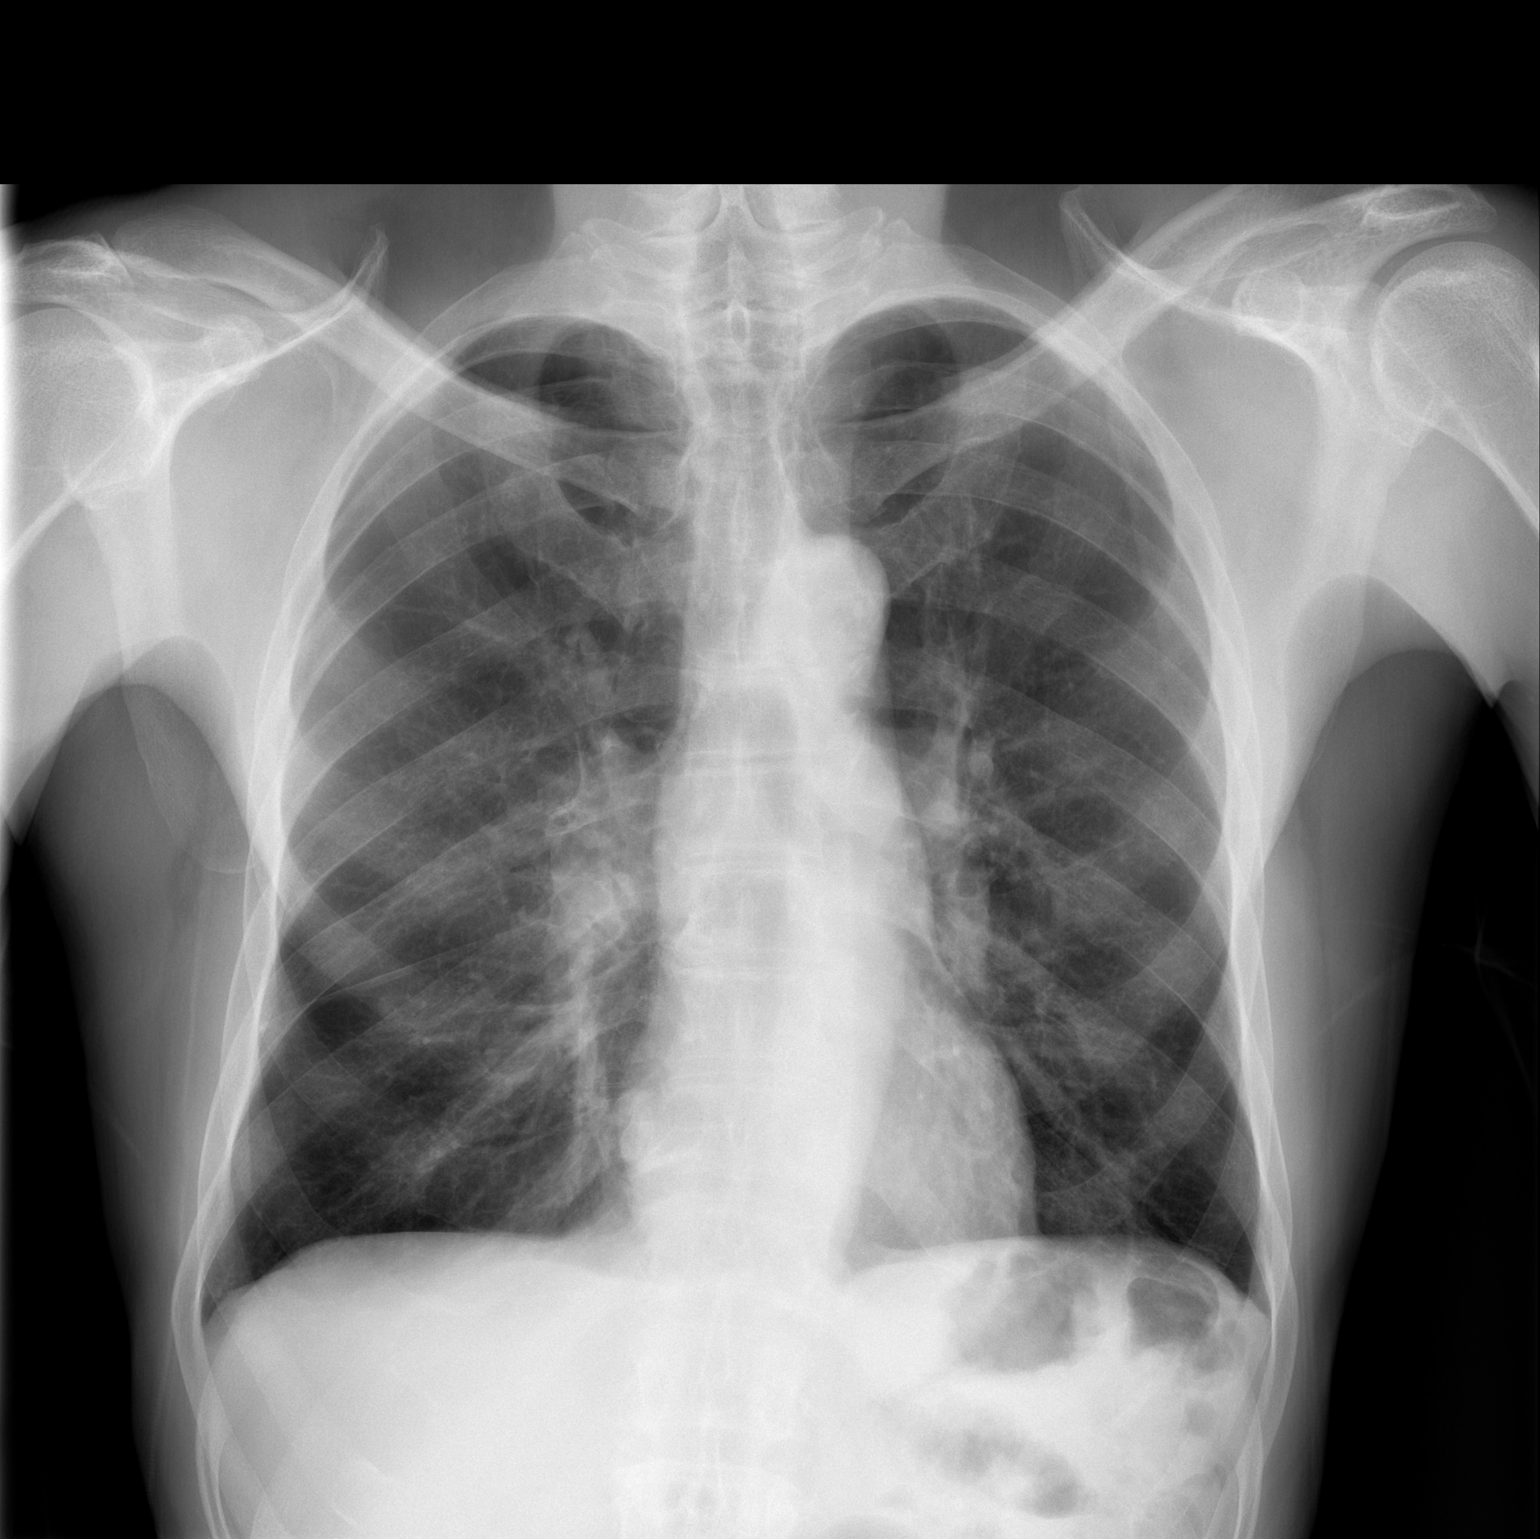

[w chest lat]
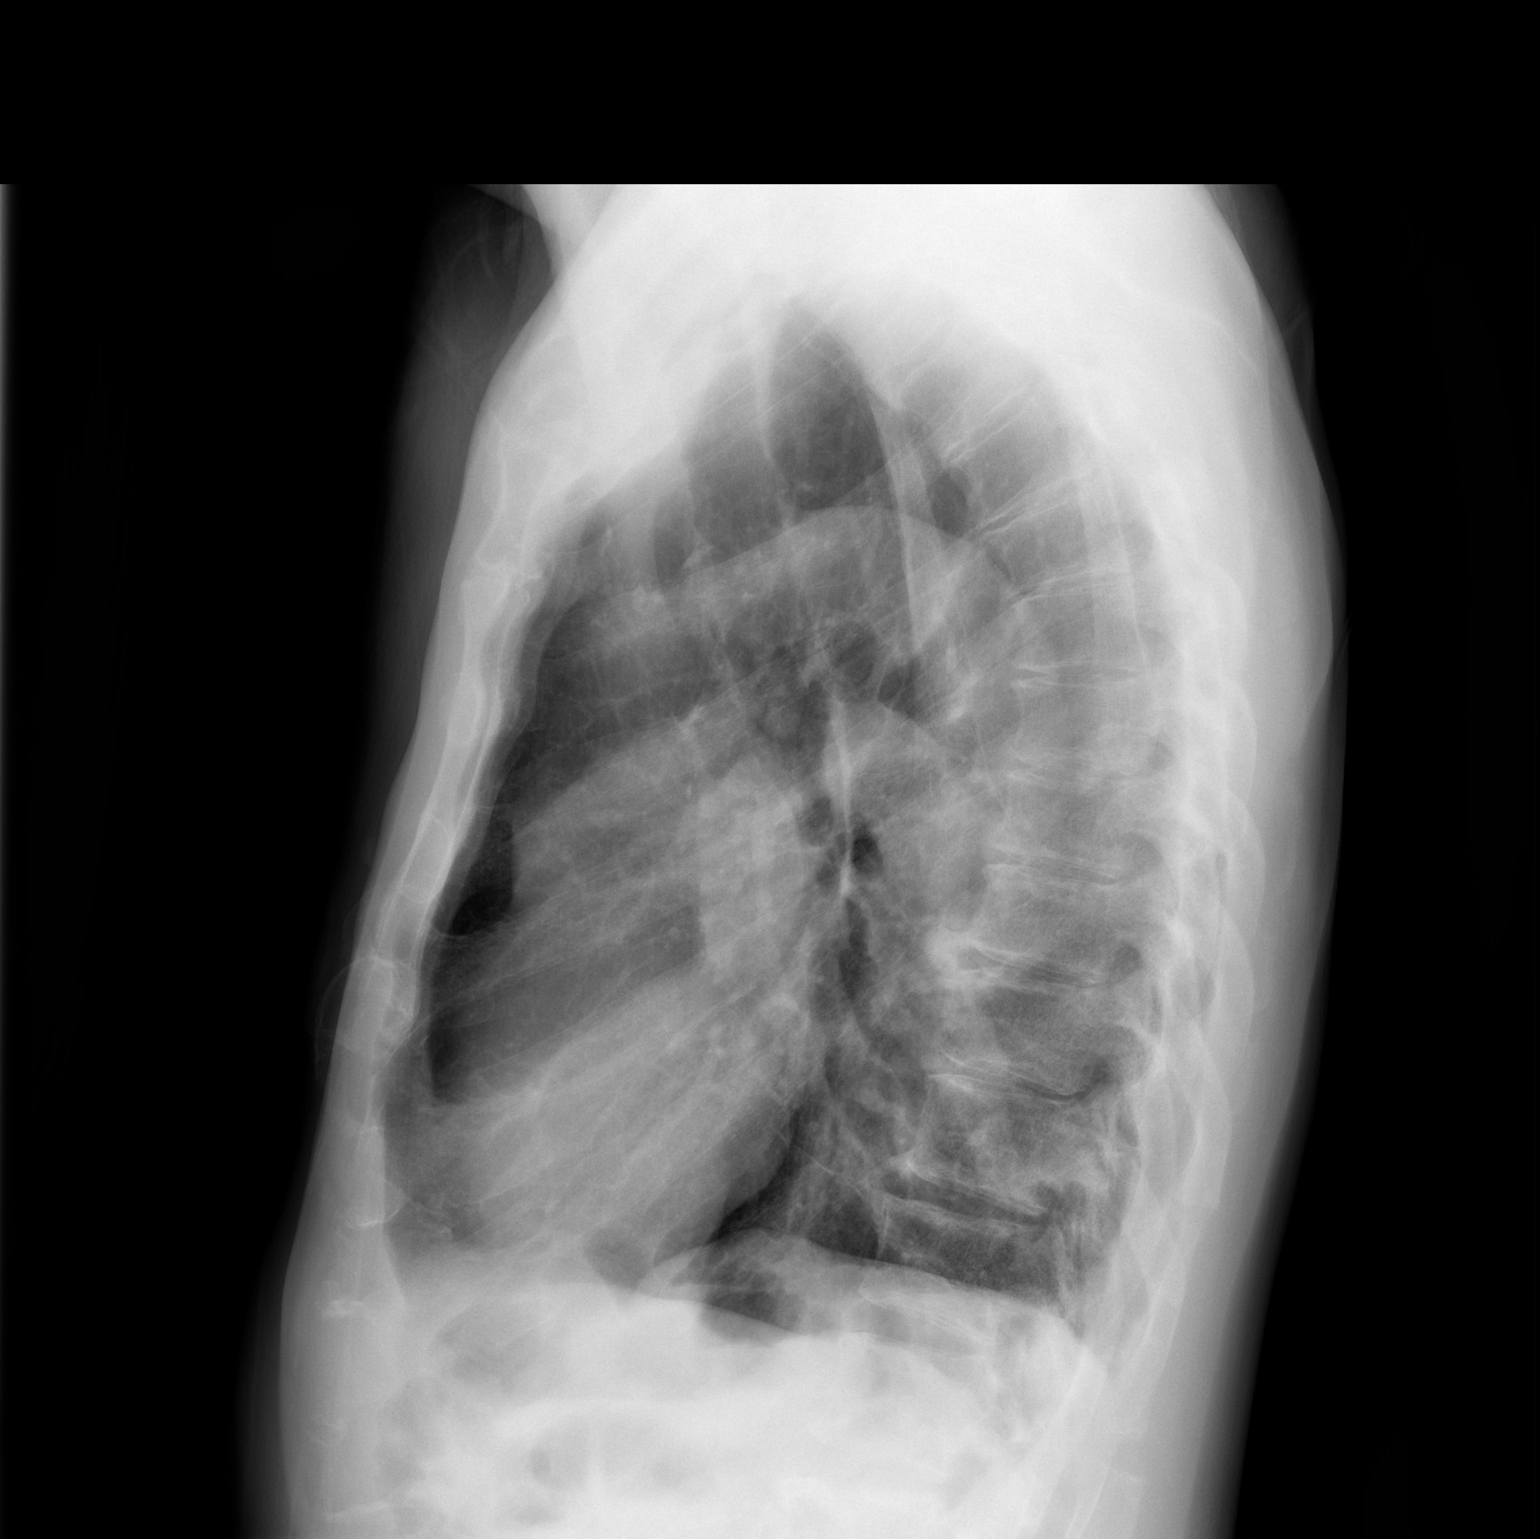

[2 of 2 positions shown; findings below may reference images not displayed]

FINDINGS: Cardiomediastinal silhouette unchanged in size and contour. No
evidence of central vascular congestion. No interlobular septal
thickening.

Stigmata of emphysema, with increased retrosternal airspace,
flattened hemidiaphragms, increased AP diameter, and hyperinflation
on the AP view.

No pneumothorax or pleural effusion. Coarsened interstitial
markings, with no confluent airspace disease.

No acute displaced fracture. Degenerative changes of the spine.
IMPRESSION: Emphysema, with no definite evidence of acute cardiopulmonary
disease

## 2023-10-22 DIAGNOSIS — I119 Hypertensive heart disease without heart failure: Secondary | ICD-10-CM | POA: Diagnosis not present

## 2023-10-22 DIAGNOSIS — I872 Venous insufficiency (chronic) (peripheral): Secondary | ICD-10-CM | POA: Diagnosis not present

## 2023-10-22 DIAGNOSIS — E559 Vitamin D deficiency, unspecified: Secondary | ICD-10-CM | POA: Diagnosis not present

## 2023-10-22 DIAGNOSIS — J302 Other seasonal allergic rhinitis: Secondary | ICD-10-CM | POA: Diagnosis not present

## 2023-10-22 DIAGNOSIS — H35013 Changes in retinal vascular appearance, bilateral: Secondary | ICD-10-CM | POA: Diagnosis not present

## 2023-10-22 DIAGNOSIS — H0100B Unspecified blepharitis left eye, upper and lower eyelids: Secondary | ICD-10-CM | POA: Diagnosis not present

## 2023-10-22 DIAGNOSIS — R260 Ataxic gait: Secondary | ICD-10-CM | POA: Diagnosis not present

## 2023-10-22 DIAGNOSIS — I700 Atherosclerosis of aorta: Secondary | ICD-10-CM | POA: Diagnosis not present

## 2023-10-22 DIAGNOSIS — H35033 Hypertensive retinopathy, bilateral: Secondary | ICD-10-CM | POA: Diagnosis not present

## 2023-10-22 DIAGNOSIS — N401 Enlarged prostate with lower urinary tract symptoms: Secondary | ICD-10-CM | POA: Diagnosis not present

## 2023-10-22 DIAGNOSIS — E782 Mixed hyperlipidemia: Secondary | ICD-10-CM | POA: Diagnosis not present

## 2023-10-22 DIAGNOSIS — J431 Panlobular emphysema: Secondary | ICD-10-CM | POA: Diagnosis not present

## 2023-10-22 DIAGNOSIS — H0100A Unspecified blepharitis right eye, upper and lower eyelids: Secondary | ICD-10-CM | POA: Diagnosis not present

## 2023-11-26 DIAGNOSIS — E782 Mixed hyperlipidemia: Secondary | ICD-10-CM | POA: Diagnosis not present

## 2023-11-26 DIAGNOSIS — Z125 Encounter for screening for malignant neoplasm of prostate: Secondary | ICD-10-CM | POA: Diagnosis not present

## 2023-11-26 DIAGNOSIS — D509 Iron deficiency anemia, unspecified: Secondary | ICD-10-CM | POA: Diagnosis not present

## 2023-11-26 DIAGNOSIS — I119 Hypertensive heart disease without heart failure: Secondary | ICD-10-CM | POA: Diagnosis not present

## 2023-11-26 DIAGNOSIS — D631 Anemia in chronic kidney disease: Secondary | ICD-10-CM | POA: Diagnosis not present

## 2023-11-26 DIAGNOSIS — E559 Vitamin D deficiency, unspecified: Secondary | ICD-10-CM | POA: Diagnosis not present

## 2024-01-28 DIAGNOSIS — E559 Vitamin D deficiency, unspecified: Secondary | ICD-10-CM | POA: Diagnosis not present

## 2024-01-28 DIAGNOSIS — D613 Idiopathic aplastic anemia: Secondary | ICD-10-CM | POA: Diagnosis not present

## 2024-01-28 DIAGNOSIS — D509 Iron deficiency anemia, unspecified: Secondary | ICD-10-CM | POA: Diagnosis not present

## 2024-01-28 DIAGNOSIS — J302 Other seasonal allergic rhinitis: Secondary | ICD-10-CM | POA: Diagnosis not present

## 2024-01-28 DIAGNOSIS — I700 Atherosclerosis of aorta: Secondary | ICD-10-CM | POA: Diagnosis not present

## 2024-01-28 DIAGNOSIS — N401 Enlarged prostate with lower urinary tract symptoms: Secondary | ICD-10-CM | POA: Diagnosis not present

## 2024-01-28 DIAGNOSIS — M47817 Spondylosis without myelopathy or radiculopathy, lumbosacral region: Secondary | ICD-10-CM | POA: Diagnosis not present

## 2024-01-28 DIAGNOSIS — E782 Mixed hyperlipidemia: Secondary | ICD-10-CM | POA: Diagnosis not present

## 2024-01-28 DIAGNOSIS — I872 Venous insufficiency (chronic) (peripheral): Secondary | ICD-10-CM | POA: Diagnosis not present

## 2024-01-28 DIAGNOSIS — R260 Ataxic gait: Secondary | ICD-10-CM | POA: Diagnosis not present

## 2024-01-28 DIAGNOSIS — J441 Chronic obstructive pulmonary disease with (acute) exacerbation: Secondary | ICD-10-CM | POA: Diagnosis not present

## 2024-01-28 DIAGNOSIS — J431 Panlobular emphysema: Secondary | ICD-10-CM | POA: Diagnosis not present

## 2024-01-28 DIAGNOSIS — I119 Hypertensive heart disease without heart failure: Secondary | ICD-10-CM | POA: Diagnosis not present

## 2024-04-14 DIAGNOSIS — L308 Other specified dermatitis: Secondary | ICD-10-CM | POA: Diagnosis not present

## 2024-04-14 DIAGNOSIS — E559 Vitamin D deficiency, unspecified: Secondary | ICD-10-CM | POA: Diagnosis not present

## 2024-04-14 DIAGNOSIS — I119 Hypertensive heart disease without heart failure: Secondary | ICD-10-CM | POA: Diagnosis not present

## 2024-04-14 DIAGNOSIS — L720 Epidermal cyst: Secondary | ICD-10-CM | POA: Diagnosis not present

## 2024-04-14 DIAGNOSIS — I872 Venous insufficiency (chronic) (peripheral): Secondary | ICD-10-CM | POA: Diagnosis not present

## 2024-04-14 DIAGNOSIS — E782 Mixed hyperlipidemia: Secondary | ICD-10-CM | POA: Diagnosis not present

## 2024-04-14 DIAGNOSIS — D638 Anemia in other chronic diseases classified elsewhere: Secondary | ICD-10-CM | POA: Diagnosis not present

## 2024-04-14 DIAGNOSIS — M47817 Spondylosis without myelopathy or radiculopathy, lumbosacral region: Secondary | ICD-10-CM | POA: Diagnosis not present

## 2024-04-14 DIAGNOSIS — Z23 Encounter for immunization: Secondary | ICD-10-CM | POA: Diagnosis not present

## 2024-04-14 DIAGNOSIS — M5451 Vertebrogenic low back pain: Secondary | ICD-10-CM | POA: Diagnosis not present

## 2024-04-14 DIAGNOSIS — N401 Enlarged prostate with lower urinary tract symptoms: Secondary | ICD-10-CM | POA: Diagnosis not present

## 2024-04-14 DIAGNOSIS — J441 Chronic obstructive pulmonary disease with (acute) exacerbation: Secondary | ICD-10-CM | POA: Diagnosis not present

## 2024-05-11 DIAGNOSIS — N401 Enlarged prostate with lower urinary tract symptoms: Secondary | ICD-10-CM | POA: Diagnosis not present

## 2024-05-11 DIAGNOSIS — Z125 Encounter for screening for malignant neoplasm of prostate: Secondary | ICD-10-CM | POA: Diagnosis not present

## 2024-05-11 DIAGNOSIS — I119 Hypertensive heart disease without heart failure: Secondary | ICD-10-CM | POA: Diagnosis not present

## 2024-05-11 DIAGNOSIS — H5703 Miosis: Secondary | ICD-10-CM | POA: Diagnosis not present

## 2024-05-11 DIAGNOSIS — Z113 Encounter for screening for infections with a predominantly sexual mode of transmission: Secondary | ICD-10-CM | POA: Diagnosis not present

## 2024-05-11 DIAGNOSIS — E782 Mixed hyperlipidemia: Secondary | ICD-10-CM | POA: Diagnosis not present

## 2024-05-11 DIAGNOSIS — M2550 Pain in unspecified joint: Secondary | ICD-10-CM | POA: Diagnosis not present

## 2024-05-11 DIAGNOSIS — R8290 Unspecified abnormal findings in urine: Secondary | ICD-10-CM | POA: Diagnosis not present

## 2024-05-11 DIAGNOSIS — D509 Iron deficiency anemia, unspecified: Secondary | ICD-10-CM | POA: Diagnosis not present
